# Patient Record
Sex: Female | Born: 1960
Health system: Southern US, Community
[De-identification: ages and names within clinical notes are randomized; demographics above are authoritative.]

## PROBLEM LIST (undated history)

## (undated) DIAGNOSIS — G629 Polyneuropathy, unspecified: Secondary | ICD-10-CM

## (undated) DIAGNOSIS — I639 Cerebral infarction, unspecified: Secondary | ICD-10-CM

## (undated) DIAGNOSIS — I351 Nonrheumatic aortic (valve) insufficiency: Secondary | ICD-10-CM

## (undated) DIAGNOSIS — F419 Anxiety disorder, unspecified: Secondary | ICD-10-CM

## (undated) DIAGNOSIS — M4722 Other spondylosis with radiculopathy, cervical region: Secondary | ICD-10-CM

## (undated) DIAGNOSIS — K5790 Diverticulosis of intestine, part unspecified, without perforation or abscess without bleeding: Secondary | ICD-10-CM

## (undated) DIAGNOSIS — I340 Nonrheumatic mitral (valve) insufficiency: Secondary | ICD-10-CM

## (undated) DIAGNOSIS — I10 Essential (primary) hypertension: Secondary | ICD-10-CM

## (undated) DIAGNOSIS — D649 Anemia, unspecified: Secondary | ICD-10-CM

## (undated) DIAGNOSIS — J45909 Unspecified asthma, uncomplicated: Secondary | ICD-10-CM

## (undated) DIAGNOSIS — I8290 Acute embolism and thrombosis of unspecified vein: Secondary | ICD-10-CM

## (undated) DIAGNOSIS — K5792 Diverticulitis of intestine, part unspecified, without perforation or abscess without bleeding: Secondary | ICD-10-CM

## (undated) DIAGNOSIS — E785 Hyperlipidemia, unspecified: Secondary | ICD-10-CM

## (undated) DIAGNOSIS — N289 Disorder of kidney and ureter, unspecified: Secondary | ICD-10-CM

## (undated) DIAGNOSIS — F1910 Other psychoactive substance abuse, uncomplicated: Secondary | ICD-10-CM

## (undated) DIAGNOSIS — F329 Major depressive disorder, single episode, unspecified: Secondary | ICD-10-CM

## (undated) DIAGNOSIS — IMO0002 Reserved for concepts with insufficient information to code with codable children: Secondary | ICD-10-CM

## (undated) HISTORY — DX: Acute embolism and thrombosis of unspecified vein: I82.90

## (undated) HISTORY — DX: Diverticulosis of intestine, part unspecified, without perforation or abscess without bleeding: K57.90

## (undated) HISTORY — DX: Unspecified asthma, uncomplicated: J45.909

## (undated) HISTORY — DX: Reserved for concepts with insufficient information to code with codable children: IMO0002

## (undated) HISTORY — DX: Diverticulitis of intestine, part unspecified, without perforation or abscess without bleeding: K57.92

## (undated) HISTORY — DX: Disorder of kidney and ureter, unspecified: N28.9

## (undated) HISTORY — DX: Nonrheumatic aortic (valve) insufficiency: I35.1

## (undated) HISTORY — DX: Anemia, unspecified: D64.9

## (undated) HISTORY — DX: Polyneuropathy, unspecified: G62.9

## (undated) HISTORY — PX: ORTHOPEDIC SURGERY: SHX850

## (undated) HISTORY — DX: Other psychoactive substance abuse, uncomplicated: F19.10

## (undated) HISTORY — DX: Hyperlipidemia, unspecified: E78.5

## (undated) HISTORY — DX: Nonrheumatic mitral (valve) insufficiency: I34.0

## (undated) HISTORY — DX: Major depressive disorder, single episode, unspecified: F32.9

## (undated) HISTORY — PX: CHOLECYSTECTOMY: SHX55

---

## 1990-01-16 HISTORY — PX: KNEE ARTHROSCOPY: SUR90

## 1998-06-19 ENCOUNTER — Encounter: Payer: Self-pay | Admitting: *Deleted

## 1998-06-19 ENCOUNTER — Inpatient Hospital Stay (HOSPITAL_COMMUNITY): Admission: AD | Admit: 1998-06-19 | Discharge: 1998-06-19 | Payer: Self-pay | Admitting: *Deleted

## 1998-06-29 ENCOUNTER — Encounter: Admission: RE | Admit: 1998-06-29 | Discharge: 1998-06-29 | Payer: Self-pay | Admitting: Obstetrics & Gynecology

## 1999-07-29 ENCOUNTER — Encounter (INDEPENDENT_AMBULATORY_CARE_PROVIDER_SITE_OTHER): Payer: Self-pay | Admitting: Specialist

## 1999-07-29 ENCOUNTER — Encounter (HOSPITAL_BASED_OUTPATIENT_CLINIC_OR_DEPARTMENT_OTHER): Payer: Self-pay | Admitting: General Surgery

## 1999-07-29 ENCOUNTER — Inpatient Hospital Stay (HOSPITAL_COMMUNITY): Admission: EM | Admit: 1999-07-29 | Discharge: 1999-07-31 | Payer: Self-pay | Admitting: Emergency Medicine

## 1999-07-30 ENCOUNTER — Encounter (HOSPITAL_BASED_OUTPATIENT_CLINIC_OR_DEPARTMENT_OTHER): Payer: Self-pay | Admitting: General Surgery

## 2001-01-16 HISTORY — PX: OTHER SURGICAL HISTORY: SHX169

## 2001-02-04 ENCOUNTER — Emergency Department (HOSPITAL_COMMUNITY): Admission: EM | Admit: 2001-02-04 | Discharge: 2001-02-04 | Payer: Self-pay | Admitting: Emergency Medicine

## 2001-03-27 ENCOUNTER — Other Ambulatory Visit: Admission: RE | Admit: 2001-03-27 | Discharge: 2001-03-27 | Payer: Self-pay | Admitting: Family Medicine

## 2001-05-15 ENCOUNTER — Ambulatory Visit (HOSPITAL_BASED_OUTPATIENT_CLINIC_OR_DEPARTMENT_OTHER): Admission: RE | Admit: 2001-05-15 | Discharge: 2001-05-15 | Payer: Self-pay | Admitting: Orthopedic Surgery

## 2002-04-16 ENCOUNTER — Emergency Department (HOSPITAL_COMMUNITY): Admission: EM | Admit: 2002-04-16 | Discharge: 2002-04-16 | Payer: Self-pay | Admitting: Emergency Medicine

## 2002-07-07 ENCOUNTER — Other Ambulatory Visit: Admission: RE | Admit: 2002-07-07 | Discharge: 2002-07-07 | Payer: Self-pay | Admitting: Family Medicine

## 2003-03-10 ENCOUNTER — Encounter: Admission: RE | Admit: 2003-03-10 | Discharge: 2003-03-10 | Payer: Self-pay | Admitting: Internal Medicine

## 2003-03-13 ENCOUNTER — Ambulatory Visit (HOSPITAL_COMMUNITY): Admission: RE | Admit: 2003-03-13 | Discharge: 2003-03-13 | Payer: Self-pay | Admitting: Internal Medicine

## 2003-04-20 ENCOUNTER — Encounter: Admission: RE | Admit: 2003-04-20 | Discharge: 2003-04-20 | Payer: Self-pay | Admitting: Internal Medicine

## 2003-05-20 ENCOUNTER — Encounter: Admission: RE | Admit: 2003-05-20 | Discharge: 2003-05-20 | Payer: Self-pay | Admitting: Internal Medicine

## 2003-08-18 ENCOUNTER — Emergency Department (HOSPITAL_COMMUNITY): Admission: EM | Admit: 2003-08-18 | Discharge: 2003-08-18 | Payer: Self-pay

## 2003-08-21 ENCOUNTER — Encounter: Admission: RE | Admit: 2003-08-21 | Discharge: 2003-08-21 | Payer: Self-pay | Admitting: Internal Medicine

## 2003-12-30 ENCOUNTER — Encounter
Admission: RE | Admit: 2003-12-30 | Discharge: 2004-02-08 | Payer: Self-pay | Admitting: Physical Medicine & Rehabilitation

## 2004-01-01 ENCOUNTER — Ambulatory Visit: Payer: Self-pay | Admitting: Physical Medicine & Rehabilitation

## 2004-01-03 ENCOUNTER — Encounter
Admission: RE | Admit: 2004-01-03 | Discharge: 2004-01-03 | Payer: Self-pay | Admitting: Physical Medicine & Rehabilitation

## 2004-02-02 ENCOUNTER — Ambulatory Visit: Payer: Self-pay | Admitting: Hematology & Oncology

## 2004-02-02 ENCOUNTER — Encounter: Admission: RE | Admit: 2004-02-02 | Discharge: 2004-05-02 | Payer: Self-pay | Admitting: Anesthesiology

## 2004-02-02 ENCOUNTER — Ambulatory Visit: Payer: Self-pay | Admitting: Anesthesiology

## 2004-02-05 ENCOUNTER — Ambulatory Visit (HOSPITAL_COMMUNITY): Admission: RE | Admit: 2004-02-05 | Discharge: 2004-02-05 | Payer: Self-pay | Admitting: Anesthesiology

## 2004-04-05 ENCOUNTER — Inpatient Hospital Stay (HOSPITAL_COMMUNITY): Admission: AD | Admit: 2004-04-05 | Discharge: 2004-04-05 | Payer: Self-pay | Admitting: *Deleted

## 2004-04-21 ENCOUNTER — Encounter: Admission: RE | Admit: 2004-04-21 | Discharge: 2004-04-21 | Payer: Self-pay | Admitting: Family Medicine

## 2004-10-02 ENCOUNTER — Emergency Department (HOSPITAL_COMMUNITY): Admission: EM | Admit: 2004-10-02 | Discharge: 2004-10-02 | Payer: Self-pay | Admitting: Emergency Medicine

## 2004-10-17 ENCOUNTER — Encounter: Admission: RE | Admit: 2004-10-17 | Discharge: 2005-01-15 | Payer: Self-pay | Admitting: Orthopedic Surgery

## 2005-07-21 ENCOUNTER — Other Ambulatory Visit: Admission: RE | Admit: 2005-07-21 | Discharge: 2005-07-21 | Payer: Self-pay | Admitting: Obstetrics and Gynecology

## 2005-07-26 ENCOUNTER — Ambulatory Visit (HOSPITAL_COMMUNITY): Admission: RE | Admit: 2005-07-26 | Discharge: 2005-07-26 | Payer: Self-pay | Admitting: Obstetrics and Gynecology

## 2005-08-24 ENCOUNTER — Other Ambulatory Visit: Admission: RE | Admit: 2005-08-24 | Discharge: 2005-08-24 | Payer: Self-pay | Admitting: Obstetrics and Gynecology

## 2005-10-14 ENCOUNTER — Emergency Department (HOSPITAL_COMMUNITY): Admission: EM | Admit: 2005-10-14 | Discharge: 2005-10-14 | Payer: Self-pay | Admitting: Emergency Medicine

## 2005-12-17 ENCOUNTER — Emergency Department (HOSPITAL_COMMUNITY): Admission: EM | Admit: 2005-12-17 | Discharge: 2005-12-17 | Payer: Self-pay | Admitting: Emergency Medicine

## 2006-07-08 ENCOUNTER — Emergency Department (HOSPITAL_COMMUNITY): Admission: EM | Admit: 2006-07-08 | Discharge: 2006-07-08 | Payer: Self-pay | Admitting: Emergency Medicine

## 2006-07-31 ENCOUNTER — Ambulatory Visit (HOSPITAL_COMMUNITY): Admission: RE | Admit: 2006-07-31 | Discharge: 2006-07-31 | Payer: Self-pay | Admitting: Obstetrics and Gynecology

## 2006-11-12 ENCOUNTER — Emergency Department (HOSPITAL_COMMUNITY): Admission: EM | Admit: 2006-11-12 | Discharge: 2006-11-12 | Payer: Self-pay | Admitting: Emergency Medicine

## 2007-08-01 ENCOUNTER — Ambulatory Visit (HOSPITAL_COMMUNITY): Admission: RE | Admit: 2007-08-01 | Discharge: 2007-08-01 | Payer: Self-pay | Admitting: Obstetrics and Gynecology

## 2008-08-07 ENCOUNTER — Emergency Department (HOSPITAL_COMMUNITY): Admission: EM | Admit: 2008-08-07 | Discharge: 2008-08-07 | Payer: Self-pay | Admitting: Emergency Medicine

## 2009-11-02 ENCOUNTER — Inpatient Hospital Stay (HOSPITAL_COMMUNITY)
Admission: AD | Admit: 2009-11-02 | Discharge: 2009-11-02 | Payer: Self-pay | Source: Home / Self Care | Admitting: Family Medicine

## 2009-12-09 ENCOUNTER — Emergency Department (HOSPITAL_COMMUNITY): Admission: EM | Admit: 2009-12-09 | Discharge: 2009-12-09 | Payer: Self-pay | Admitting: Family Medicine

## 2010-01-20 ENCOUNTER — Ambulatory Visit (HOSPITAL_COMMUNITY)
Admission: RE | Admit: 2010-01-20 | Discharge: 2010-01-20 | Payer: Self-pay | Source: Home / Self Care | Attending: Obstetrics and Gynecology | Admitting: Obstetrics and Gynecology

## 2010-01-20 LAB — CBC
HCT: 33.7 % — ABNORMAL LOW (ref 36.0–46.0)
Hemoglobin: 10.7 g/dL — ABNORMAL LOW (ref 12.0–15.0)
MCH: 25.1 pg — ABNORMAL LOW (ref 26.0–34.0)
MCHC: 31.8 g/dL (ref 30.0–36.0)
MCV: 79.1 fL (ref 78.0–100.0)
Platelets: 343 10*3/uL (ref 150–400)
RBC: 4.26 MIL/uL (ref 3.87–5.11)
RDW: 17.9 % — ABNORMAL HIGH (ref 11.5–15.5)
WBC: 8.3 10*3/uL (ref 4.0–10.5)

## 2010-01-20 LAB — PREGNANCY, URINE: Preg Test, Ur: NEGATIVE

## 2010-02-06 ENCOUNTER — Encounter: Payer: Self-pay | Admitting: Obstetrics and Gynecology

## 2010-03-30 LAB — CBC
HCT: 34.3 % — ABNORMAL LOW (ref 36.0–46.0)
Hemoglobin: 10.9 g/dL — ABNORMAL LOW (ref 12.0–15.0)
MCH: 26.2 pg (ref 26.0–34.0)
MCHC: 31.9 g/dL (ref 30.0–36.0)
MCV: 82.2 fL (ref 78.0–100.0)
Platelets: 358 10*3/uL (ref 150–400)
RBC: 4.17 MIL/uL (ref 3.87–5.11)
RDW: 20.8 % — ABNORMAL HIGH (ref 11.5–15.5)
WBC: 11.8 10*3/uL — ABNORMAL HIGH (ref 4.0–10.5)

## 2010-03-30 LAB — GC/CHLAMYDIA PROBE AMP, GENITAL
Chlamydia, DNA Probe: NEGATIVE
GC Probe Amp, Genital: NEGATIVE

## 2010-03-30 LAB — URINE MICROSCOPIC-ADD ON

## 2010-03-30 LAB — URINALYSIS, ROUTINE W REFLEX MICROSCOPIC
Bilirubin Urine: NEGATIVE
Glucose, UA: NEGATIVE mg/dL
Ketones, ur: NEGATIVE mg/dL
Leukocytes, UA: NEGATIVE
Nitrite: NEGATIVE
Protein, ur: NEGATIVE mg/dL
Specific Gravity, Urine: >1.03 — ABNORMAL HIGH (ref 1.005–1.030)
Urobilinogen, UA: 0.2 mg/dL (ref 0.0–1.0)
pH: 5.5 (ref 5.0–8.0)

## 2010-03-30 LAB — WET PREP, GENITAL
Trich, Wet Prep: NONE SEEN
Yeast Wet Prep HPF POC: NONE SEEN

## 2010-03-30 LAB — POCT PREGNANCY, URINE: Preg Test, Ur: NEGATIVE

## 2010-04-08 ENCOUNTER — Other Ambulatory Visit (HOSPITAL_COMMUNITY): Payer: Self-pay | Admitting: Family Medicine

## 2010-04-08 DIAGNOSIS — Z1231 Encounter for screening mammogram for malignant neoplasm of breast: Secondary | ICD-10-CM

## 2010-04-19 ENCOUNTER — Ambulatory Visit (HOSPITAL_COMMUNITY): Payer: Medicare Other

## 2010-04-26 ENCOUNTER — Ambulatory Visit (HOSPITAL_COMMUNITY)
Admission: RE | Admit: 2010-04-26 | Discharge: 2010-04-26 | Disposition: A | Discharge status: Home / Self Care | Payer: Medicare Other | Source: Ambulatory Visit | Attending: Family Medicine | Admitting: Family Medicine

## 2010-04-26 DIAGNOSIS — Z1231 Encounter for screening mammogram for malignant neoplasm of breast: Secondary | ICD-10-CM | POA: Insufficient documentation

## 2010-06-03 NOTE — Op Note (Signed)
[facility]  Patient:    Judith Mccullough, Judith Mccullough                    MRN: [id number] Proc. Date: 07/30/99 Adm. Date:  [id number] Disc. Date: [id number] Attending:  Fortino Sic CC:         Teena Irani. Arlyce Dice, M.D.                           Operative Report  PREOPERATIVE DIAGNOSIS:  Symptomatic cholelithiasis.  POSTOPERATIVE DIAGNOSIS: Symptomatic cholelithiasis.  OPERATION PERFORMED:  Laparoscopic cholecystectomy with intraoperative cholangiogram.  SURGEON:  Marnee Spring. Wiliam Ke, M.D.  ASSISTANT:  Mardene Celeste. Lurene Shadow, M.D.  ANESTHESIA:  Endotracheal by hospital.  DESCRIPTION OF PROCEDURE:  After good endotracheal anesthesia, the skin of the abdomen was prepped and draped in the usual manner.  The abdomen was entered through an incision just above the umbilicus. Pursestring suture was placed. The Hasson cannula was inserted and lower pneumoperitoneum was obtained.  The 10 mm and two 5 mm cannulas were placed in the usual fashion.  There were no other abnormalities seen with the laparoscope.  Dissecting the port of hepatis, the cystic duct and gallbladder junction was identified.  The cystic duct was cannulated, and a cholangiogram was perform using a fluoroscopic technique.  This was normal.  The cystic artery was triply clipped and cut leaving two clips proximally.  The cystic duct was triply clipped and cut leaving two clips proximally.  The gallbladder was removed from the liver bed with electrocautery current, and hemostasis was obtained with electrocautery current.  Hemostasis was thought to be good.  The right upper quadrant was copiously irrigated with saline and sucked dry.  The cannula was removed.  The pursestring suture was tied.  The wounds were closed with subcuticular 4-0 Dexon and Steri-Strips were applied.  Estimated blood loss 150 cc. The patient received no blood and left the operating room in satisfactory condition after sponge and needle counts  were verified. DD:  07/30/99 TD:  07/30/99 Job: [id number]

## 2010-06-03 NOTE — Op Note (Signed)
[facility]  Patient:    Judith Mccullough, Judith Mccullough Visit Number: [id number] MRN: [id number]          Service Type: DSU Location: [facility] Attending Physician:  Marlowe Shores Dictated by:   Artist Pais Mina Marble, M.D. Proc. Date: 05/15/01 Admit Date:  05/15/2001                             Operative Report  PREOPERATIVE DIAGNOSIS:  Chronic right thumb ulnar collateral ligament tear.  POSTOPERATIVE DIAGNOSIS:  Chronic right thumb ulnar collateral ligament tear.  PROCEDURES:  Reconstruction of chronic right thumb ulnar collateral ligament tear using extensor pollicis longus tendon transfer.  SURGEON:  Artist Pais. Mina Marble, M.D.  ASSISTANT:  Junius Roads. Ireton, P.A.C.  ANESTHESIA:  General.  TOURNIQUET TIME:  56 minutes.  COMPLICATIONS:  None.  DRAINS:  None.  DESCRIPTION OF PROCEDURE:  The patient was taken to the operating room.  After the induction of general anesthesia, the right upper extremity was prepped and draped in the usual sterile fashion.  An Esmarch was used to exsanguinate the limb.  The tourniquet was then inflated to 250 mmHg.  At this point in time, a C-shaped incision was made over the ulnar side of the metatarsophalangeal joint of the dominant right thumb and a large dorsal based flap was elevated. A large branch of the radial sensory nerve was identified and the volar aspect of the wound retracted appropriately.  At this point in time, the abductor aponeurosis was longitudinally splint 5 mm from its insertion of the EPL tendon and flaps were raised with the EPL tendon retracted dorsally and the remaining adductor aponeurosis volarly.  Next, the metatarsophalangeal joint capsule arthrotomy was performed longitudinally of the dorsal ulnar aspect. Dissection was carried down to the ulnar side of the metatarsophalangeal joint where a complete tear of the ulnar collateral ligament was seen coming off of the proximal phalanx, chronic  in nature.  A reconstruction was then undertaken using the EPL tendon as a static transfer.  A second incision was made on the dorsal radial aspect of the wrist and the APL musculotendinous junction was identified.  The EPL was also identified at the insertion of the thumb metacarpal base.  There were two separate and distinct slips.  The radial most slip was identified.  It was transected in the proximal wound at the musculotendinous junction, drawn underneath the skin, and then tunneled under the EPL and EPB.  It was then used to reconstruct the ulnar side of the collateral ligament area in the following manner.  The capsule overlying the ulnar side of the metacarpal ligament joint was pierced.  The EPL tendon transfer was passed through the capsule and then secured tightly into a transosseous canal that went from volar ulnar to dorsal radial on the base of the proximal phalanx, though recreating the ulnar collateral ligament.  This was tied over a well-padded button on the dorsal radial side of the thumb under appropriate tension.  Next, the capsule was repaired using 4-0 Mersilene to incorporate the tendon transfer as well followed by repair of the aponeurosis using the same 4-0 Mersilene stitch in a locked epitendonous-type suture.  The wound was then thoroughly irrigated.  The two incisions were closed with 3-0 Prolene in subcuticular stitches.  Steri-Strips, 4 x 4s, fluffs, and a radial gutter splint were applied.  The patient tolerated the procedure well and went to recovery in stable fashion.  Dictated by:   Artist Pais Mina Marble, M.D. Attending Physician:  Marlowe Shores DD:  05/15/01 TD:  05/15/01 Job: [id number]

## 2010-06-03 NOTE — Discharge Summary (Signed)
[facility]  Patient:    LAKYNN, HALVORSEN                    MRN: [id number] Adm. Date:  [id number] Disc. Date: [id number] Attending:  Fortino Sic CC:         Teena Irani. Arlyce Dice, M.D.                           Discharge Summary  ADMISSION DIAGNOSIS:  Symptomatic cholelithiasis.  DISCHARGE DIAGNOSIS:  Symptomatic cholelithiasis.  OPERATION PERFORMED:  July 30, 1999, laparoscopic cholecystectomy with intraoperative cholangiogram.  HISTORY OF PRESENT ILLNESS:  This is a 50 year old female with a three day history of abdominal pain and known gallstones.  Admission laboratory work revealed a slightly elevated SGPT, slightly elevated white count.  HOSPITAL COURSE:  The patient was admitted and prepared for surgery and surgery was performed.  Her postoperative course was benign.  At the time of discharge she is up and about tolerating a diet.  DISCHARGE CONDITION:  Stable.  DIET:  As tolerated.  AMBULATION:  No lifting or calisthenics.  DISCHARGE MEDICATION:  Dilaudid.  FOLLOW-UP: She is to see me in 6-7 days for wound care. DD:  07/31/99 TD:  07/31/99 Job: [id number]

## 2010-06-03 NOTE — Group Therapy Note (Signed)
REFERRING PHYSICIAN:  Ernestina Penna, M.D.   DATE OF BIRTH:  10-Mar-1960.   DATE OF SERVICE:  January 01, 2004.   MEDICAL RECORD NUMBER:  Is #[id number].   HISTORY:  The patient is a 50 year old female who injured her right thumb at  work with ulnar collateral ligament tear and underwent reconstructive  surgery on the ligament per Dr. Mina Marble in 2003.  She has had pain in her  right upper extremity pretty much since that time.  She indicates that it  goes up to her shoulder.  It runs along the lateral aspect of her arm down  to her thumb and first finger.  She rates her pain on average as a 6 out of  10, going from 5 to 8.  It improves with rest.  It is made worse with a lot  of use.  She has not worked since June 2004.  She has had additional workup  including bone scan, February 2005, showing no evidence of RSD.  She did  have some mild degenerative changes in the MCP and PIP of the right thumb.  She saw Dr. Metta Clines on one occasion and then also saw Dr. Yolanda Bonine here in  [place] who did a stellate ganglion block x 2 with some questionable  temporary relief.  She has tried high dose Neurontin, has tried a Media planner, has tried a TENS unit none of which were particularly helpful.  She  has tried Ultram with some equivocal results from this.   PAST MEDICAL HISTORY:  1.  Asthma.  2.  Hypertension.  3.  Depression/anxiety.  4.  Poor sleep.   DRUG ALLERGIES:  Are more of intolerance:  1.  CODEINE caused nausea.  2.  KLONOPIN caused chest pain.  3.  CYMBALTA caused a crazy feeling.   CURRENT MEDICATIONS:  1.  Toprol XL 100 mg p.o. every day.  2.  Advair inhaler p.r.n.  3.  Seroquel 25 p.o. every day.   SOCIAL HISTORY:  Smokes a pack a day for ten years, lives with two kids,  married but separated.   REVIEW OF SYSTEMS:  Positive for nausea, poor appetite, and also for  weakness and numbness right arm, some depression, poor sleep, and headaches.  Denies neck pain.  No  suicidal thoughts.   PHYSICAL EXAMINATION:  VITAL SIGNS:  Blood pressure 110/103.  She states she  took her blood pressure medicine this morning.  Pulse 90, respirations 16,  O2 sat 100% on room air.  GAIT:  Normal.  AFFECT:  Bright, alert.  APPEARANCE:  Normal.  NECK:  Full range of motion.  Spurling test is negative.  She has decreased  sensation, in fact hyperesthesia in C6 dermatome on the right side.  Equal  sensation in the 7th and 8th dermatomes.  MUSCULOSKELETAL:  Her strength is mildly reduced but with some pain  inhibition in the right deltoid biceps area, relatively intact in the  triceps, and reduction in grip secondary to pain in the thumb.  The left  upper extremity is 5/5 strength and lower extremities with normal strength  as well.  She has hyper-reflexic biceps, triceps reflexes bilaterally and  normal in the lower extremities at the patellar and Achilles.  She has good  range of motion at the shoulder.  Decreased range of motion right thumb and  she does guard it.  She has no skin dystrophic changes that I can note in  the upper extremity.  Her extremities are  warm.   IMPRESSION:  1.  Right upper extremity discomfort.  2.  History of ulnar collateral ligament tear, right thumb and has had a      more proximal spread to her pain symptoms following reconstructive      surgery.   1.  Typical treatment and workup for RSD have been unrevealing/unhelpful and      this is either a recalcitrant case or some other diagnosis may be      entertained.  To that effect, would check MRI of her cervical spine and      see whether she has a cervical radiculopathy C6 most likely involvement.  2.  Trial of Lyrica in case this is a mainly neuropathic pain syndrome and      would like to treat it as such.  3.  Referral to Dr. Celene Kras to see if there is anything for further      evaluation and evaluation from an invasive standpoint.  4.  I will see her back after Dr. Stevphen Rochester has had  a chance to evaluate her,      treat her.   Note, the patient states that she has no litigation pending in regards to  any of her complaints.  I do see a note, from December 30, 2002, noting that  she had an attorney however.  Mention this as a possible secondary gain  issue.   CURRENT FUNCTIONAL STATUS:  She is able to do all her self care and ADLs as  well as take care of her children; however, she does not feel like she does  as good of a job as she should be.     Andr   AEK/MedQ  D:  01/01/2004 13:34:17  T:  01/01/2004 16:50:42  Job #:  [id number]   cc:   Ernestina Penna, M.D.  [address] [place]  [place] [address]  Fax: [phone]   [facility]

## 2010-06-03 NOTE — Assessment & Plan Note (Signed)
HISTORY:  The [facility],  referred by Dr. Victorino Sparrow. Kirsteins.  She is a pleasant individual whom I  evaluated with the chart, available imaging, progress to date and overall  directed care approach.  She has not worked since 2003, stating that she  wants to get back to work, improve her function and quality of life indices.  She is trouble by right upper extremity pain.   I have reviewed the MRI.  It appears to be fairly normal, and she reveals no  external evidence of RSD or pseudo-motor changes, describing only modest  dysesthesias around the thumb.  She has had surgery here, but I see no  muscle wasting or decreased grip strength.  She appears to have adequate  functional capacity to the hand.  Pain is the persistent problem.  She also  has suprascapular and levator scapular pain, directed rightward, with the  patient's rotational capacity on flexion and extension.   I think she probably has a mixed component of pain in the hand and a  surgical procedure, peripheral neuropathy unspecified and cervicalgia,  probably secondary to cervical facet.   RECOMMENDATIONS:  1.  Cervical cessation for the best outcome.  2.  She has had stellate ganglion blocks, and other assessment by two other      pain doctors.  One is Dr. Jasmine Pang, stating that she has CRPS, but      failed stellate ganglion block.  Also Dr. Ewing Schlein, who is unsure.      I will try to obtain some records.  3.  As a diagnostic assessment, I am going to go ahead and review the bone      scan which was many months ago, and I think was equivocal.  I think it      needs a refreshment, as she is applying for disability, and I really do      not see any disabling features here.  By her own admission, she wants to      go back to work, which is promising.  I discussed modifiable features in      her health profile, such as cigarette cessation for the best outcome.  4.   Consider injecting the facet as a positive pain generator, with positive      provocative block, with consideration for radiofrequency neural ablation      which may reveal some of her pain presentation.  Certainly the      cigarettes and lifestyle will play into cervicalgia from a cervical      facet medial branch component.  5.  Will maintain her on her current medications.  I do not plan any changes      at this time.  She remains on Lyrica.  Consider adding Lidoderm.  6.  I am going to obtain a PNCV EMG of the upper extremity, to rule out      underlying pathology, which we might not be able to see here.  She does      not think she has had this done.  Will compare this to the three-phase      bone scan and determine further course of care.   OBJECTIVE:  Diffuse paracervical and suprascapular myofascial discomfort  with positive cervical facetal compression test, right greater than left.  Her hand has no impairment of grip strength.  Some distractibility.  Intact  neurological presentation.  Capillary filling is fine, and I do  not see any  pseudo-motor changes.  No new neurological findings in motor, sensory, or  reflexes.   IMPRESSION:  1.  Cervicalgia.  2.  Cervical facet syndrome.  3.  Peripheral neuropathy, unspecified.   PLAN:  As outlined above.   FOLLOWUP:  I will see her in followup for consideration of cervical facetal  injection.  Enhance our data base and functional assessment, as well as her  diagnostic parameters.  I have reviewed with the patient.  No barrier to  communication.       HH/MedQ  D:  02/02/2004 10:06:43  T:  02/02/2004 10:47:56  Job #:  [id number]

## 2010-08-07 ENCOUNTER — Inpatient Hospital Stay (HOSPITAL_COMMUNITY): Payer: Medicare Other

## 2010-08-07 ENCOUNTER — Emergency Department (HOSPITAL_COMMUNITY): Payer: Medicare Other

## 2010-08-07 ENCOUNTER — Inpatient Hospital Stay (HOSPITAL_COMMUNITY)
Admission: EM | Admit: 2010-08-07 | Discharge: 2010-08-11 | DRG: 064 | Disposition: A | Discharge status: Home / Self Care | Payer: Medicare Other | Source: Ambulatory Visit | Attending: Family Medicine | Admitting: Family Medicine

## 2010-08-07 ENCOUNTER — Emergency Department (HOSPITAL_COMMUNITY)
Admission: EM | Admit: 2010-08-07 | Discharge: 2010-08-07 | Disposition: A | Discharge status: Hospice | Payer: Medicare Other | Source: Home / Self Care | Attending: Emergency Medicine | Admitting: Emergency Medicine

## 2010-08-07 DIAGNOSIS — G90519 Complex regional pain syndrome I of unspecified upper limb: Secondary | ICD-10-CM | POA: Diagnosis present

## 2010-08-07 DIAGNOSIS — I6350 Cerebral infarction due to unspecified occlusion or stenosis of unspecified cerebral artery: Secondary | ICD-10-CM | POA: Insufficient documentation

## 2010-08-07 DIAGNOSIS — H6690 Otitis media, unspecified, unspecified ear: Secondary | ICD-10-CM | POA: Diagnosis present

## 2010-08-07 DIAGNOSIS — J189 Pneumonia, unspecified organism: Secondary | ICD-10-CM | POA: Diagnosis present

## 2010-08-07 DIAGNOSIS — R5383 Other fatigue: Secondary | ICD-10-CM | POA: Insufficient documentation

## 2010-08-07 DIAGNOSIS — N390 Urinary tract infection, site not specified: Secondary | ICD-10-CM | POA: Diagnosis present

## 2010-08-07 DIAGNOSIS — Z8249 Family history of ischemic heart disease and other diseases of the circulatory system: Secondary | ICD-10-CM

## 2010-08-07 DIAGNOSIS — H109 Unspecified conjunctivitis: Secondary | ICD-10-CM | POA: Diagnosis present

## 2010-08-07 DIAGNOSIS — F17200 Nicotine dependence, unspecified, uncomplicated: Secondary | ICD-10-CM | POA: Diagnosis present

## 2010-08-07 DIAGNOSIS — R5381 Other malaise: Secondary | ICD-10-CM | POA: Insufficient documentation

## 2010-08-07 DIAGNOSIS — R29810 Facial weakness: Secondary | ICD-10-CM | POA: Insufficient documentation

## 2010-08-07 DIAGNOSIS — E785 Hyperlipidemia, unspecified: Secondary | ICD-10-CM | POA: Diagnosis present

## 2010-08-07 DIAGNOSIS — Z79899 Other long term (current) drug therapy: Secondary | ICD-10-CM | POA: Insufficient documentation

## 2010-08-07 DIAGNOSIS — F1410 Cocaine abuse, uncomplicated: Secondary | ICD-10-CM | POA: Diagnosis present

## 2010-08-07 DIAGNOSIS — I10 Essential (primary) hypertension: Secondary | ICD-10-CM | POA: Insufficient documentation

## 2010-08-07 DIAGNOSIS — D649 Anemia, unspecified: Secondary | ICD-10-CM | POA: Diagnosis present

## 2010-08-07 DIAGNOSIS — F1210 Cannabis abuse, uncomplicated: Secondary | ICD-10-CM | POA: Diagnosis present

## 2010-08-07 DIAGNOSIS — I6340 Cerebral infarction due to embolism of unspecified cerebral artery: Principal | ICD-10-CM | POA: Diagnosis present

## 2010-08-07 DIAGNOSIS — Z7982 Long term (current) use of aspirin: Secondary | ICD-10-CM

## 2010-08-07 DIAGNOSIS — F341 Dysthymic disorder: Secondary | ICD-10-CM | POA: Diagnosis present

## 2010-08-07 DIAGNOSIS — R4789 Other speech disturbances: Secondary | ICD-10-CM | POA: Diagnosis present

## 2010-08-07 DIAGNOSIS — N289 Disorder of kidney and ureter, unspecified: Secondary | ICD-10-CM | POA: Diagnosis present

## 2010-08-07 DIAGNOSIS — G8190 Hemiplegia, unspecified affecting unspecified side: Secondary | ICD-10-CM | POA: Diagnosis present

## 2010-08-07 DIAGNOSIS — Z888 Allergy status to other drugs, medicaments and biological substances status: Secondary | ICD-10-CM

## 2010-08-07 DIAGNOSIS — D6859 Other primary thrombophilia: Secondary | ICD-10-CM | POA: Diagnosis present

## 2010-08-07 LAB — URINALYSIS, ROUTINE W REFLEX MICROSCOPIC
Bilirubin Urine: NEGATIVE
Bilirubin Urine: NEGATIVE
Glucose, UA: NEGATIVE mg/dL
Glucose, UA: NEGATIVE mg/dL
Hgb urine dipstick: NEGATIVE
Hgb urine dipstick: NEGATIVE
Ketones, ur: NEGATIVE mg/dL
Ketones, ur: NEGATIVE mg/dL
Leukocytes, UA: NEGATIVE
Nitrite: NEGATIVE
Nitrite: NEGATIVE
Protein, ur: NEGATIVE mg/dL
Protein, ur: NEGATIVE mg/dL
Specific Gravity, Urine: 1.023 (ref 1.005–1.030)
Specific Gravity, Urine: 1.013 (ref 1.005–1.030)
Urobilinogen, UA: 1 mg/dL (ref 0.0–1.0)
Urobilinogen, UA: 0.2 mg/dL (ref 0.0–1.0)
pH: 6 (ref 5.0–8.0)
pH: 6 (ref 5.0–8.0)

## 2010-08-07 LAB — COMPREHENSIVE METABOLIC PANEL
ALT: 10 U/L (ref 0–35)
ALT: 8 U/L (ref 0–35)
AST: 14 U/L (ref 0–37)
AST: 13 U/L (ref 0–37)
Albumin: 3.7 g/dL (ref 3.5–5.2)
Albumin: 3 g/dL — ABNORMAL LOW (ref 3.5–5.2)
Alkaline Phosphatase: 86 U/L (ref 39–117)
Alkaline Phosphatase: 76 U/L (ref 39–117)
BUN: 15 mg/dL (ref 6–23)
BUN: 12 mg/dL (ref 6–23)
CO2: 23 mEq/L (ref 19–32)
CO2: 23 mEq/L (ref 19–32)
Calcium: 9.8 mg/dL (ref 8.4–10.5)
Calcium: 9.1 mg/dL (ref 8.4–10.5)
Chloride: 102 mEq/L (ref 96–112)
Chloride: 102 mEq/L (ref 96–112)
Creatinine, Ser: 1.65 mg/dL — ABNORMAL HIGH (ref 0.50–1.10)
Creatinine, Ser: 1.46 mg/dL — ABNORMAL HIGH (ref 0.50–1.10)
GFR calc Af Amer: 40 mL/min — ABNORMAL LOW (ref >60)
GFR calc Af Amer: 46 mL/min — ABNORMAL LOW (ref >60)
GFR calc non Af Amer: 33 mL/min — ABNORMAL LOW (ref >60)
GFR calc non Af Amer: 38 mL/min — ABNORMAL LOW (ref >60)
Glucose, Bld: 124 mg/dL — ABNORMAL HIGH (ref 70–99)
Glucose, Bld: 109 mg/dL — ABNORMAL HIGH (ref 70–99)
Potassium: 3.9 mEq/L (ref 3.5–5.1)
Potassium: 3.8 mEq/L (ref 3.5–5.1)
Sodium: 135 mEq/L (ref 135–145)
Sodium: 136 mEq/L (ref 135–145)
Total Bilirubin: 0.2 mg/dL — ABNORMAL LOW (ref 0.3–1.2)
Total Bilirubin: 0.3 mg/dL (ref 0.3–1.2)
Total Protein: 7.7 g/dL (ref 6.0–8.3)
Total Protein: 6.8 g/dL (ref 6.0–8.3)

## 2010-08-07 LAB — DIFFERENTIAL
Basophils Absolute: 0.1 10*3/uL (ref 0.0–0.1)
Basophils Relative: 0 % (ref 0–1)
Eosinophils Absolute: 0.1 10*3/uL (ref 0.0–0.7)
Eosinophils Relative: 0 % (ref 0–5)
Lymphocytes Relative: 23 % (ref 12–46)
Lymphs Abs: 3.1 10*3/uL (ref 0.7–4.0)
Monocytes Absolute: 0.6 10*3/uL (ref 0.1–1.0)
Monocytes Relative: 4 % (ref 3–12)
Neutro Abs: 9.8 10*3/uL — ABNORMAL HIGH (ref 1.7–7.7)
Neutrophils Relative %: 72 % (ref 43–77)

## 2010-08-07 LAB — CK TOTAL AND CKMB (NOT AT ARMC)
CK, MB: 1.8 ng/mL (ref 0.3–4.0)
Relative Index: 1 (ref 0.0–2.5)
Total CK: 188 U/L — ABNORMAL HIGH (ref 7–177)

## 2010-08-07 LAB — TROPONIN I: Troponin I: <0.3 ng/mL (ref <0.30)

## 2010-08-07 LAB — POCT I-STAT, CHEM 8
BUN: 16 mg/dL (ref 6–23)
Calcium, Ion: 1.19 mmol/L (ref 1.12–1.32)
Chloride: 105 mEq/L (ref 96–112)
Creatinine, Ser: 1.6 mg/dL — ABNORMAL HIGH (ref 0.50–1.10)
Glucose, Bld: 136 mg/dL — ABNORMAL HIGH (ref 70–99)
HCT: 38 % (ref 36.0–46.0)
Hemoglobin: 12.9 g/dL (ref 12.0–15.0)
Potassium: 4.1 mEq/L (ref 3.5–5.1)
Sodium: 138 mEq/L (ref 135–145)
TCO2: 23 mmol/L (ref 0–100)

## 2010-08-07 LAB — CARDIAC PANEL(CRET KIN+CKTOT+MB+TROPI)
CK, MB: 1.9 ng/mL (ref 0.3–4.0)
Relative Index: 1 (ref 0.0–2.5)
Total CK: 188 U/L — ABNORMAL HIGH (ref 7–177)
Troponin I: <0.3 ng/mL (ref <0.30)

## 2010-08-07 LAB — CBC
HCT: 36 % (ref 36.0–46.0)
HCT: 33.7 % — ABNORMAL LOW (ref 36.0–46.0)
Hemoglobin: 12 g/dL (ref 12.0–15.0)
Hemoglobin: 11.2 g/dL — ABNORMAL LOW (ref 12.0–15.0)
MCH: 27 pg (ref 26.0–34.0)
MCH: 27 pg (ref 26.0–34.0)
MCHC: 33.3 g/dL (ref 30.0–36.0)
MCHC: 33.2 g/dL (ref 30.0–36.0)
MCV: 80.9 fL (ref 78.0–100.0)
MCV: 81.2 fL (ref 78.0–100.0)
Platelets: 379 10*3/uL (ref 150–400)
Platelets: 361 10*3/uL (ref 150–400)
RBC: 4.45 MIL/uL (ref 3.87–5.11)
RBC: 4.15 MIL/uL (ref 3.87–5.11)
RDW: 16.9 % — ABNORMAL HIGH (ref 11.5–15.5)
RDW: 17.2 % — ABNORMAL HIGH (ref 11.5–15.5)
WBC: 13.5 10*3/uL — ABNORMAL HIGH (ref 4.0–10.5)
WBC: 15.2 10*3/uL — ABNORMAL HIGH (ref 4.0–10.5)

## 2010-08-07 LAB — URINE MICROSCOPIC-ADD ON

## 2010-08-07 LAB — APTT
aPTT: 29 seconds (ref 24–37)
aPTT: 28 seconds (ref 24–37)

## 2010-08-07 LAB — GLUCOSE, CAPILLARY
Glucose-Capillary: 136 mg/dL — ABNORMAL HIGH (ref 70–99)
Glucose-Capillary: 97 mg/dL (ref 70–99)

## 2010-08-07 LAB — PROTIME-INR
INR: 1.01 (ref 0.00–1.49)
INR: 0.94 (ref 0.00–1.49)
Prothrombin Time: 13.5 seconds (ref 11.6–15.2)
Prothrombin Time: 12.8 seconds (ref 11.6–15.2)

## 2010-08-07 LAB — RAPID URINE DRUG SCREEN, HOSP PERFORMED
Amphetamines: NOT DETECTED
Barbiturates: NOT DETECTED
Benzodiazepines: NOT DETECTED
Cocaine: POSITIVE — AB
Opiates: NOT DETECTED
Tetrahydrocannabinol: POSITIVE — AB

## 2010-08-07 LAB — LIPASE, BLOOD: Lipase: 20 U/L (ref 11–59)

## 2010-08-07 LAB — HEMOGLOBIN A1C
Hgb A1c MFr Bld: 6.2 % — ABNORMAL HIGH (ref <5.7)
Mean Plasma Glucose: 131 mg/dL — ABNORMAL HIGH (ref <117)

## 2010-08-07 LAB — MRSA PCR SCREENING: MRSA by PCR: NEGATIVE

## 2010-08-08 ENCOUNTER — Inpatient Hospital Stay (HOSPITAL_COMMUNITY): Payer: Medicare Other

## 2010-08-08 LAB — CBC
HCT: 34.3 % — ABNORMAL LOW (ref 36.0–46.0)
Hemoglobin: 11.1 g/dL — ABNORMAL LOW (ref 12.0–15.0)
MCH: 26.6 pg (ref 26.0–34.0)
MCHC: 32.4 g/dL (ref 30.0–36.0)
MCV: 82.3 fL (ref 78.0–100.0)
Platelets: 363 10*3/uL (ref 150–400)
RBC: 4.17 MIL/uL (ref 3.87–5.11)
RDW: 17.4 % — ABNORMAL HIGH (ref 11.5–15.5)
WBC: 14.2 10*3/uL — ABNORMAL HIGH (ref 4.0–10.5)

## 2010-08-08 LAB — LIPID PANEL
Cholesterol: 218 mg/dL — ABNORMAL HIGH (ref 0–200)
HDL: 33 mg/dL — ABNORMAL LOW (ref >39)
LDL Cholesterol: 130 mg/dL — ABNORMAL HIGH (ref 0–99)
Total CHOL/HDL Ratio: 6.6 RATIO
Triglycerides: 277 mg/dL — ABNORMAL HIGH (ref <150)
VLDL: 55 mg/dL — ABNORMAL HIGH (ref 0–40)

## 2010-08-08 LAB — COMPREHENSIVE METABOLIC PANEL
ALT: 8 U/L (ref 0–35)
AST: 14 U/L (ref 0–37)
Albumin: 2.9 g/dL — ABNORMAL LOW (ref 3.5–5.2)
Alkaline Phosphatase: 75 U/L (ref 39–117)
BUN: 10 mg/dL (ref 6–23)
CO2: 24 mEq/L (ref 19–32)
Calcium: 9 mg/dL (ref 8.4–10.5)
Chloride: 106 mEq/L (ref 96–112)
Creatinine, Ser: 1.51 mg/dL — ABNORMAL HIGH (ref 0.50–1.10)
GFR calc Af Amer: 45 mL/min — ABNORMAL LOW (ref >60)
GFR calc non Af Amer: 37 mL/min — ABNORMAL LOW (ref >60)
Glucose, Bld: 97 mg/dL (ref 70–99)
Potassium: 4 mEq/L (ref 3.5–5.1)
Sodium: 139 mEq/L (ref 135–145)
Total Bilirubin: 0.4 mg/dL (ref 0.3–1.2)
Total Protein: 6.6 g/dL (ref 6.0–8.3)

## 2010-08-08 LAB — PROTIME-INR
INR: 1.04 (ref 0.00–1.49)
Prothrombin Time: 13.8 seconds (ref 11.6–15.2)

## 2010-08-08 LAB — TSH: TSH: 1.82 u[IU]/mL (ref 0.350–4.500)

## 2010-08-08 MED ORDER — GADOBENATE DIMEGLUMINE 529 MG/ML IV SOLN: 20.0000 mL | Freq: Once | INTRAVENOUS | Status: AC | PRN

## 2010-08-08 MED ORDER — GADOBENATE DIMEGLUMINE 529 MG/ML IV SOLN
20.0000 mL | Freq: Once | INTRAVENOUS | Status: AC | PRN
  Administered 2010-08-08: 20 mL via INTRAVENOUS

## 2010-08-08 NOTE — H&P (Signed)
Judith Mccullough, Judith Mccullough          ACCOUNT NO.:  [id number]  MEDICAL RECORD NO.:  [id number]  LOCATION:                                 FACILITY:  PHYSICIAN:  Conley Canal, MD      DATE OF BIRTH:  Feb 05, 1960  DATE OF ADMISSION: DATE OF DISCHARGE:                             HISTORY & PHYSICAL   PRIMARY CARE PHYSICIAN:  Tracey Harries, MD  CHIEF COMPLAINT:  Slurred speech, left-sided weakness.  HISTORY OF PRESENT ILLNESS:  [name] is a 50 year old female with history of hypertension, apparently polysubstance abuse, tobacco habituation who came in as a transfer from [facility] where she had presented with complaints of slurred speech and left-sided weakness.  The patient states that she noticed something was wrong last night, she could not specify the time when she felt that something was wrong, but she did not come to the emergency room immediately as she was worried.  She noted that her speech was slurred and she has had right-sided headache since last night and she went to sleep and when she wake up this morning at around 4:00 a.m. with the same symptoms, she decided to come to the emergency room.  She denies any other complaints.  No fever.  No shortness of breath.  No cough.  No diarrhea, vomiting.  No genitourinary symptoms but when she presented at first she was found to have an acute right insular ribbon sign consistent with acute ischemia and was hence referred to the hospitalist service for further management.  She was not given t-PA apparently because she was outside the stroke window as the time of beginning of symptoms was not clear.  A urine toxicology screen was positive for cocaine, marijuana.  PAST MEDICAL HISTORY: 1. Hypertension. 2. Tobacco habituation.  ALLERGIES:  CODEINE.  SOCIAL HISTORY:  The patient lives with her son.  Smokes cigars occasionally.  Drinks alcohol.  She is on disability.  FAMILY HISTORY:  She mentions  history of acute myocardial infarction in her father.  HOME MEDICATIONS:  Diovan.  REVIEW OF SYSTEMS:  Unremarkable except as highlighted in the history of present illness.  PHYSICAL EXAMINATION:  GENERAL:  This is a middle-aged lady, not in acute distress.  She has loss of left nasolabial fold. HEENT:  Pupils equal, reacting to light. NECK:  No jugular venous distention.  No carotid bruits. RESPIRATORY:  She has a weak cough, coarse rhonchi at left base.  No wheezing. CARDIOVASCULAR:  First and second heart sounds heard.  No murmurs. Pulse regular. ABDOMEN:  Soft, nontender.  No palpable organomegaly.  Bowel sounds normal. CENTRAL NERVOUS SYSTEM:  The patient has slurred speech, loss of left nasolabial fold.  Power of 4/5 in the left upper extremity, 5/5 in all other extremities.  Some hyperreflexia, left knee.  Babinski upgoing, left foot.  IMAGING STUDIES:  A CT brain without contrast showed right insular ribbon sign which is most in keeping with acute ischemia.  I do not see an EKG.  IMPRESSION:  This is a 50 year old female with history of tobacco habituation, polysubstance abuse, hypertension who came in with acute infarct involving the right insular region.  She also has possibility of left lower  lobe pneumonia.  Her labs also include BUN of 16, creatinine 1.6, WBC is 8.5.  She likely aspirated and possibly has a urinary tract infection.  PLAN: 1. Acute nonhemorrhagic infarct in the right insular region with     slurred speech and left hemiparesis.  Cause could be the cocaine,     hypertension, and other possible etiologies.  The patient will be     admitted to the neuro ICU.  Neurology input is appreciated.     Meanwhile, we will pursue stroke workup including MRI of brain, 2-D     echocardiogram, carotid duplex, lipid panel.  We will also ask     physical therapy and speech therapy to work with her. 2. Hypertension.  The patient is on Diovan at home.  We will  monitor     BP.  Hydralazine p.r.n. 3. Possible left lower lobe pneumonia, community acquired versus     aspiration and also possibility of UTI.  We will cover the patient     with Zosyn and azithromycin. 4. Renal insufficiency could probably be related to dehydration and     Diovan.  We will hold Diovan, gently rehydrate the patient, avoid     nephrotoxic drugs. 5. DVT prophylaxis.     Conley Canal, MD     SR/MEDQ  D:  08/07/2010  T:  08/07/2010  Job:  [id number]  cc:   Tracey Harries, M.D.  Electronically Signed by Conley Canal  on 08/08/2010 02:52:04 PM

## 2010-08-09 DIAGNOSIS — I6330 Cerebral infarction due to thrombosis of unspecified cerebral artery: Secondary | ICD-10-CM

## 2010-08-09 LAB — BASIC METABOLIC PANEL
BUN: 10 mg/dL (ref 6–23)
CO2: 25 mEq/L (ref 19–32)
Calcium: 8.9 mg/dL (ref 8.4–10.5)
Chloride: 105 mEq/L (ref 96–112)
Creatinine, Ser: 1.55 mg/dL — ABNORMAL HIGH (ref 0.50–1.10)
GFR calc Af Amer: 43 mL/min — ABNORMAL LOW (ref >60)
GFR calc non Af Amer: 36 mL/min — ABNORMAL LOW (ref >60)
Glucose, Bld: 91 mg/dL (ref 70–99)
Potassium: 3.9 mEq/L (ref 3.5–5.1)
Sodium: 137 mEq/L (ref 135–145)

## 2010-08-09 LAB — CBC
HCT: 33.2 % — ABNORMAL LOW (ref 36.0–46.0)
Hemoglobin: 10.6 g/dL — ABNORMAL LOW (ref 12.0–15.0)
MCH: 26.3 pg (ref 26.0–34.0)
MCHC: 31.9 g/dL (ref 30.0–36.0)
MCV: 82.4 fL (ref 78.0–100.0)
Platelets: 324 10*3/uL (ref 150–400)
RBC: 4.03 MIL/uL (ref 3.87–5.11)
RDW: 17 % — ABNORMAL HIGH (ref 11.5–15.5)
WBC: 11.8 10*3/uL — ABNORMAL HIGH (ref 4.0–10.5)

## 2010-08-09 LAB — SEDIMENTATION RATE: Sed Rate: 51 mm/hr — ABNORMAL HIGH (ref 0–22)

## 2010-08-09 LAB — URINE CULTURE
Colony Count: NO GROWTH
Culture  Setup Time: 201207230121
Culture: NO GROWTH

## 2010-08-09 LAB — RPR: RPR Ser Ql: NONREACTIVE

## 2010-08-09 LAB — HIV ANTIBODY (ROUTINE TESTING W REFLEX): HIV: NONREACTIVE

## 2010-08-09 LAB — HOMOCYSTEINE: Homocysteine: 9.9 umol/L (ref 4.0–15.4)

## 2010-08-10 ENCOUNTER — Inpatient Hospital Stay (HOSPITAL_COMMUNITY): Payer: Medicare Other

## 2010-08-10 DIAGNOSIS — I6789 Other cerebrovascular disease: Secondary | ICD-10-CM

## 2010-08-10 LAB — CBC
HCT: 33.9 % — ABNORMAL LOW (ref 36.0–46.0)
Hemoglobin: 11 g/dL — ABNORMAL LOW (ref 12.0–15.0)
MCH: 26.4 pg (ref 26.0–34.0)
MCHC: 32.4 g/dL (ref 30.0–36.0)
MCV: 81.3 fL (ref 78.0–100.0)
Platelets: 336 10*3/uL (ref 150–400)
RBC: 4.17 MIL/uL (ref 3.87–5.11)
RDW: 16.6 % — ABNORMAL HIGH (ref 11.5–15.5)
WBC: 10.2 10*3/uL (ref 4.0–10.5)

## 2010-08-10 LAB — FOLATE: Folate: 9.8 ng/mL

## 2010-08-10 LAB — BASIC METABOLIC PANEL
BUN: 8 mg/dL (ref 6–23)
CO2: 24 mEq/L (ref 19–32)
Calcium: 9.3 mg/dL (ref 8.4–10.5)
Chloride: 102 mEq/L (ref 96–112)
Creatinine, Ser: 1.54 mg/dL — ABNORMAL HIGH (ref 0.50–1.10)
GFR calc Af Amer: 44 mL/min — ABNORMAL LOW (ref >60)
GFR calc non Af Amer: 36 mL/min — ABNORMAL LOW (ref >60)
Glucose, Bld: 88 mg/dL (ref 70–99)
Potassium: 3.7 mEq/L (ref 3.5–5.1)
Sodium: 136 mEq/L (ref 135–145)

## 2010-08-10 LAB — LUPUS ANTICOAGULANT PANEL
DRVVT: 44.2 secs (ref 36.2–44.3)
Lupus Anticoagulant: NOT DETECTED
PTT Lupus Anticoagulant: 41.3 secs (ref 30.0–45.6)

## 2010-08-10 LAB — ANA: Anti Nuclear Antibody(ANA): NEGATIVE

## 2010-08-10 LAB — PROTEIN C ACTIVITY: Protein C Activity: 171 % — ABNORMAL HIGH (ref 75–133)

## 2010-08-10 LAB — C3 COMPLEMENT: C3 Complement: 167 mg/dL (ref 90–180)

## 2010-08-10 LAB — IRON AND TIBC
Iron: 27 ug/dL — ABNORMAL LOW (ref 42–135)
Saturation Ratios: 8 % — ABNORMAL LOW (ref 20–55)
TIBC: 327 ug/dL (ref 250–470)
UIBC: 300 ug/dL

## 2010-08-10 LAB — ANTITHROMBIN III: AntiThromb III Func: 130 % — ABNORMAL HIGH (ref 76–126)

## 2010-08-10 LAB — PROTEIN C, TOTAL: Protein C, Total: 175 % — ABNORMAL HIGH (ref 72–160)

## 2010-08-10 LAB — PROTEIN S, TOTAL: Protein S Ag, Total: 180 % — ABNORMAL HIGH (ref 60–150)

## 2010-08-10 LAB — PROTEIN S ACTIVITY: Protein S Activity: 111 % (ref 69–129)

## 2010-08-10 LAB — FERRITIN: Ferritin: 58 ng/mL (ref 10–291)

## 2010-08-10 LAB — C4 COMPLEMENT: Complement C4, Body Fluid: 47 mg/dL — ABNORMAL HIGH (ref 10–40)

## 2010-08-10 LAB — FACTOR 5 LEIDEN

## 2010-08-10 LAB — VITAMIN B12: Vitamin B-12: 657 pg/mL (ref 211–911)

## 2010-08-11 ENCOUNTER — Inpatient Hospital Stay (HOSPITAL_COMMUNITY): Payer: Medicare Other

## 2010-08-11 LAB — PROTHROMBIN GENE MUTATION

## 2010-08-11 LAB — COMPLEMENT, TOTAL: Compl, Total (CH50): >60 U/mL — ABNORMAL HIGH (ref 31–60)

## 2010-08-12 LAB — BETA-2-GLYCOPROTEIN I ABS, IGG/M/A
Beta-2 Glyco I IgG: 0 G Units (ref <20)
Beta-2-Glycoprotein I IgA: 3 A Units (ref <20)
Beta-2-Glycoprotein I IgM: 6 M Units (ref <20)

## 2010-08-12 LAB — CARDIOLIPIN ANTIBODIES, IGG, IGM, IGA
Anticardiolipin IgA: 3 APL U/mL — ABNORMAL LOW (ref <22)
Anticardiolipin IgG: 3 GPL U/mL — ABNORMAL LOW (ref <23)
Anticardiolipin IgM: 5 MPL U/mL — ABNORMAL LOW (ref <11)

## 2010-08-14 NOTE — Discharge Summary (Signed)
Judith Mccullough, EDDLEMAN NO.:  [id number]  MEDICAL RECORD NO.:  [id number]  LOCATION:  3010                         FACILITY:  [facility]  PHYSICIAN:  Pleas Koch, MD        DATE OF BIRTH:  12-Jul-1960  DATE OF ADMISSION:  08/07/2010 DATE OF DISCHARGE:  08/11/2010                              DISCHARGE SUMMARY   PRIMARY CARE PHYSICIAN:  Pramod P. Pearlean Brownie, MD  DISCHARGE DIAGNOSES: 1. Right insular ribbon sign suggestive of acute ischemia and exam     consisted with right hemispheric cerebrovascular accident with INA     score of 99 on presentation. 2. Urinary tract infection. 3. Probable pneumonia. 4. Polysubstance abuse. 5. Hyperlipidemia with LDL of 130, total cholesterol 218. 6. Questionable hypercoagulable state, antithrombin III at 130,     protein C functional 171, protein C total 175, TH 50 compliment     over 60. 7. Anemia. 8. Conjunctivitis.  DISCHARGE MEDICATIONS:  As follows; 1. Diovan HCT 320/25 one tablet daily. 2. Ciprofloxacin Otic drops 1 tablet t.i.d. 3. Amlodipine 10 mg 1 tablet daily. 4. Crestor 10 mg at bedtime. 5. Aspirin 325 mg p.o. daily.  PERTINENT IMAGING STUDIES: 1. CT head without contrast on August 07, 2010 showed right insular     ribbon sign, mostly acute ischemia. 2. Portable chest x-ray on August 07, 2010 showed no acute disease. 3. MRA head showed prominent hyperintense inferior division of the     right cerebral artery with distal flow signal to distal extent and     recanalizing thrombus.     a.     No occlusion, stenosis, dissections, or aneurysm pain. 4. MRA of the neck showed no occlusion, dissection, or pseudoaneurysm     noted.  There is just a possible moderate stenosis in the left     vertebral basilar junction. 5. MR brain with and without contrast on August 08, 2010, showed; 6. Large restriction deficit, right middle cerebral artery as     discussed with.  She may have subsequent nonspecific subcortical     white matter  changes supratentorially.  These most likely represent     areas of ischemia that is related to small vessel disease due to     hypertension, diabetes less likely possibility of demyelinating     process of vasculitis, subsequent moderate inflammatory mucosal     thickening, paranasal sinus thickening.  Chest x-ray report showed     slight haziness at both lung bases which could represent faint     infiltrates, minimal left base atelectasis.  Swallowing study done.     Barium swallow done shows that she should be on regular diet with     thin liquids; that was August 11, 2010.  TEE done August 10, 2010, showed EF of 60%-65%, triggered with aortic regurg and mild mitral regurg, that is thrombus and atrial cavity or appendage.  No foreign material  identified.  This is a pleasant 50 year old female with hypertension, polysubstance abuse, and tobacco habituation who presented to [facility] ED with paresthesias and left-sided weakness.  She felt something was wrong and when she noticed her speech was slurred and right-sided headache, she went and slept and  woke up this morning with same symptoms and came to the emergency room.  She was not given TPA because she already had stroke window at that time and beginning of symptoms is not clear.  The urine tox is positive for cocaine and marijuana.  On exam, she was not in acute discharge.  She had loss of left nasolabial fold.  She had a weak cough, coarse rhonchi to the left base. No wheezing.  Slurred speech.  Loss of left nasolabial fold.  Power 4/5 upper extremity, left, and 5/5 in all other extremities.  Some hyperreflexia in the left knee.  Babinski upgoing in the left foot.  HOSPITAL COURSE: 1. For inpatient, acute nonhemorrhagic infarct.  Causes are unclear.     Noted that her antithrombin panel had came back.  I discussed this     with the hospitalist on-call and he said this would be passed out     to Dr. Pearlean Brownie.  Neurology was consulted  initially and they     recommended that the patient be discharged home on aspirin only and     to follow up with Dr. Pearlean Brownie in an outpatient setting.  The patient     was cleared by physical therapy, but they recommended outpatient     physical therapy and occupational therapy. 2. The patient with possible pneumonia.  The patient is covered with     Zosyn and azithromycin initially and subsequently transitioned to     only azithromycin.  She still had coughing and this was thought     possibly secondary to aspiration. 3. Modified barium swallow was done which revealed this is not the     case.  The patient was discharged home without antibiotics.  She     was afebrile and did not have any white count and had no fever. 4. Hypertension.  Her hypertension is slightly elevated in hospital.     Her thiazide was held initially.  We have added back various     medications and added Norvasc to them and blood pressures fairly     well controlled in the hospital. 5. Possible otitis.  She was given Ciprodex eardrops for her ears. 6. Conjunctivitis.  I gave her some saline eyedrops for her eyes and     this helped. 7. Hyperlipidemia. 8. Her LDL was 130 and she has been placed on Crestor.  The patient     was seen on day of discharge, was cleared by Neurology.  DISCHARGE VITAL SIGNS:  Temperature is 98.1, pulse 90, respirations 16, blood pressure 126-135/80-84, O2 sats are 97% on room air.  She still had a moderate left-sided droop and mild hemineglect on the left side and some mild weakness; however, was doing well.  She had no chest pain, no shortness of breath, no fever and will be discharged home.  The patient instructed not to forget to follow up with primary care physician as well as Dr. Pearlean Brownie in 2 to 3 months.          ______________________________ Pleas Koch, MD    JS/MEDQ  D:  08/11/2010  T:  08/11/2010  Job:  [id number]  cc:   Tracey Harries, M.D.  Electronically Signed by Pleas Koch MD on 08/14/2010 12:29:26 PM

## 2010-10-15 ENCOUNTER — Inpatient Hospital Stay (INDEPENDENT_AMBULATORY_CARE_PROVIDER_SITE_OTHER)
Admission: RE | Admit: 2010-10-15 | Discharge: 2010-10-15 | Disposition: A | Discharge status: Home / Self Care | Payer: Medicare Other | Source: Ambulatory Visit | Attending: Family Medicine | Admitting: Family Medicine

## 2010-10-15 DIAGNOSIS — J309 Allergic rhinitis, unspecified: Secondary | ICD-10-CM

## 2010-11-13 ENCOUNTER — Inpatient Hospital Stay (INDEPENDENT_AMBULATORY_CARE_PROVIDER_SITE_OTHER)
Admission: RE | Admit: 2010-11-13 | Discharge: 2010-11-13 | Disposition: A | Discharge status: Home / Self Care | Payer: PRIVATE HEALTH INSURANCE | Source: Ambulatory Visit | Attending: Family Medicine | Admitting: Family Medicine

## 2010-11-13 DIAGNOSIS — J069 Acute upper respiratory infection, unspecified: Secondary | ICD-10-CM

## 2011-01-04 ENCOUNTER — Emergency Department (INDEPENDENT_AMBULATORY_CARE_PROVIDER_SITE_OTHER)
Admission: EM | Admit: 2011-01-04 | Discharge: 2011-01-04 | Disposition: A | Discharge status: Home / Self Care | Payer: Medicare Other | Source: Home / Self Care | Attending: Emergency Medicine | Admitting: Emergency Medicine

## 2011-01-04 ENCOUNTER — Encounter: Payer: Self-pay | Admitting: *Deleted

## 2011-01-04 DIAGNOSIS — S39012A Strain of muscle, fascia and tendon of lower back, initial encounter: Secondary | ICD-10-CM

## 2011-01-04 DIAGNOSIS — S335XXA Sprain of ligaments of lumbar spine, initial encounter: Secondary | ICD-10-CM

## 2011-01-04 DIAGNOSIS — J069 Acute upper respiratory infection, unspecified: Secondary | ICD-10-CM

## 2011-01-04 HISTORY — DX: Anxiety disorder, unspecified: F41.9

## 2011-01-04 HISTORY — DX: Essential (primary) hypertension: I10

## 2011-01-04 HISTORY — DX: Cerebral infarction, unspecified: I63.9

## 2011-01-04 LAB — POCT URINALYSIS DIP (DEVICE)
Bilirubin Urine: NEGATIVE
Glucose, UA: NEGATIVE mg/dL
Hgb urine dipstick: NEGATIVE
Ketones, ur: NEGATIVE mg/dL
Leukocytes, UA: NEGATIVE
Nitrite: NEGATIVE
Protein, ur: NEGATIVE mg/dL
Specific Gravity, Urine: 1.025 (ref 1.005–1.030)
Urobilinogen, UA: 0.2 mg/dL (ref 0.0–1.0)
pH: 5 (ref 5.0–8.0)

## 2011-01-04 LAB — POCT PREGNANCY, URINE: Preg Test, Ur: NEGATIVE

## 2011-01-04 MED ORDER — HYDROCODONE-ACETAMINOPHEN 5-325 MG PO TABS: 2.0000 | ORAL_TABLET | ORAL | Status: AC | PRN

## 2011-01-04 MED ORDER — METHOCARBAMOL 500 MG PO TABS: 500.0000 mg | ORAL_TABLET | Freq: Four times a day (QID) | ORAL | Status: AC

## 2011-01-04 NOTE — ED Provider Notes (Signed)
History     CSN: [id number] Arrival date & time: 01/04/2011  8:38 AM   First MD Initiated Contact with Patient 01/04/11 [id number]      Chief Complaint  Patient presents with  . Back Pain  . URI    HPI Comments: Pt with constant achy nonradiating right lower thoracic/paralumbar pain that becomes sharp with bending over, torso rotation x 3 days. Has nausea, no vomiting. No fevers, rash, arm pain. Occ coughing, no wheezing, CP, SOB, dysuria, hematuria, urgency, frequency, oderous or cloudy urine. No recent or remote h/o injury to this area. Denies IVDU. States she loads/carries around a heavy bag every day but no other change in physical activity. States tried tylenol and half of a friend's vicodin w/o relief yesterday. Also c/o ST, nasal congestion, postnasal drip starting yesterday.   Patient is a 50 y.o. female presenting with back pain. The history is provided by the patient.  Back Pain  This is a new problem. The current episode started more than 2 days ago. The problem occurs constantly. The problem has not changed since onset.The pain is associated with lifting heavy objects and twisting. The pain is present in the thoracic spine and lumbar spine. The quality of the pain is described as aching and stabbing. The pain does not radiate. The symptoms are aggravated by bending, twisting and certain positions. The pain is the same all the time. Pertinent negatives include no chest pain, no fever, no numbness, no abdominal pain, no abdominal swelling, no bowel incontinence, no bladder incontinence, no dysuria, no paresthesias, no paresis, no tingling and no weakness.    Past Medical History  Diagnosis Date  . Asthma   . Hypertension   . Anxiety   . CVA (cerebral infarction)     Past Surgical History  Procedure Date  . Knee arthroscopy   . Orthopedic surgery   . Cholecystectomy     Family History  Problem Relation Age of Onset  . Diabetes Mother   . Hypertension Mother   . Cancer  Father   . Hypertension Father   . Diabetes Father     History  Substance Use Topics  . Smoking status: Current Everyday Smoker    Types: Cigars  . Smokeless tobacco: Not on file  . Alcohol Use: Yes     socially    OB History    Grav Para Term Preterm Abortions TAB SAB Ect Mult Living                  Review of Systems  Constitutional: Negative for fever.  HENT: Positive for congestion, sore throat, rhinorrhea and postnasal drip. Negative for ear pain, trouble swallowing and voice change.   Respiratory: Negative for cough, shortness of breath and wheezing.   Cardiovascular: Negative for chest pain.  Gastrointestinal: Positive for nausea. Negative for vomiting, abdominal pain and bowel incontinence.  Genitourinary: Negative for bladder incontinence, dysuria, urgency, hematuria and flank pain.  Musculoskeletal: Positive for back pain.  Skin: Negative for rash.  Neurological: Negative for tingling, weakness, numbness and paresthesias.    Allergies  Codeine  Home Medications   Current Outpatient Rx  Name Route Sig Dispense Refill  . ALBUTEROL SULFATE HFA 108 (90 BASE) MCG/ACT IN AERS Inhalation Inhale 2 puffs into the lungs every 6 (six) hours as needed.      Marland Kitchen AMLODIPINE BESYLATE 10 MG PO TABS Oral Take 10 mg by mouth daily.      . ASPIRIN 325 MG PO TABS Oral Take  325 mg by mouth daily.      Marland Kitchen HYDROCHLOROTHIAZIDE 12.5 MG PO TABS Oral Take 12.5 mg by mouth daily.      Marland Kitchen HYDROCODONE-ACETAMINOPHEN 5-325 MG PO TABS  1 tablet.      Marland Kitchen ROSUVASTATIN CALCIUM 10 MG PO TABS Oral Take 10 mg by mouth daily.      Marland Kitchen HYDROCODONE-ACETAMINOPHEN 5-325 MG PO TABS Oral Take 2 tablets by mouth every 4 (four) hours as needed for pain. 20 tablet 0  . METHOCARBAMOL 500 MG PO TABS Oral Take 1 tablet (500 mg total) by mouth 4 (four) times daily. 40 tablet 0    BP 109/72  Pulse 87  Temp(Src) 98.8 F (37.1 C) (Oral)  Resp 18  SpO2 99%  LMP 12/09/2010  Physical Exam  Nursing note and vitals  reviewed. Constitutional: She is oriented to person, place, and time. She appears well-developed and well-nourished. No distress.  HENT:  Head: Normocephalic and atraumatic.  Right Ear: Tympanic membrane normal.  Left Ear: Tympanic membrane normal.  Nose: Mucosal edema and rhinorrhea present. Right sinus exhibits no maxillary sinus tenderness and no frontal sinus tenderness. Left sinus exhibits no maxillary sinus tenderness and no frontal sinus tenderness.  Mouth/Throat: Uvula is midline and mucous membranes are normal. Posterior oropharyngeal erythema present. No oropharyngeal exudate or posterior oropharyngeal edema.       Cobblestoned, irritated throat  Eyes: Conjunctivae and EOM are normal. Pupils are equal, round, and reactive to light.  Neck: Normal range of motion. Neck supple.  Cardiovascular: Normal rate, regular rhythm, normal heart sounds and intact distal pulses.   Pulmonary/Chest: Effort normal and breath sounds normal.  Abdominal: Soft. Bowel sounds are normal. She exhibits no distension and no mass. There is no hepatosplenomegaly or hepatomegaly. There is no tenderness. There is no rebound and no guarding.  Musculoskeletal: Normal range of motion.       Tenderness right lower thoracic, paralumbar area. Questionable CVA tenderness.  No cervical, thoracic, lumbar sacral bony tenderness. Pain worse with torso rotation to right, lateral bending, bending forward, and with lying down. Bilateral lower extremities nontender., baseline ROM with intact PT pulses, Sensation baseline light touch bilaterally for Pt, DTR's symmetric and intact bilaterally KJ, Motor symmetric bilateral 5/5 hip flexion, quadriceps, hamstrings, EHL, foot dorsiflexion, foot plantarflexion, gait normal.    Lymphadenopathy:    She has no cervical adenopathy.  Neurological: She is alert and oriented to person, place, and time.  Skin: Skin is warm and dry. No rash noted.  Psychiatric: She has a normal mood and affect.  Her behavior is normal. Judgment and thought content normal.    ED Course  Procedures (including critical care time)   Labs Reviewed  POCT URINALYSIS DIP (DEVICE)  POCT PREGNANCY, URINE  POCT URINALYSIS DIPSTICK  POCT PREGNANCY, URINE   No results found. Results for orders placed during the hospital encounter of 01/04/11  POCT URINALYSIS DIP (DEVICE)      Component Value Range   Glucose, UA NEGATIVE  NEGATIVE (mg/dL)   Bilirubin Urine NEGATIVE  NEGATIVE    Ketones, ur NEGATIVE  NEGATIVE (mg/dL)   Specific Gravity, Urine 1.025  1.005 - 1.030    Hgb urine dipstick NEGATIVE  NEGATIVE    pH 5.0  5.0 - 8.0    Protein, ur NEGATIVE  NEGATIVE (mg/dL)   Urobilinogen, UA 0.2  0.0 - 1.0 (mg/dL)   Nitrite NEGATIVE  NEGATIVE    Leukocytes, UA NEGATIVE  NEGATIVE   POCT PREGNANCY, URINE  Component Value Range   Preg Test, Ur NEGATIVE        1. Lumbar strain   2. URI (upper respiratory infection)       MDM  [place] narcotic database queried no record of narcotic rx in past year OTHER DATA REVIEWED: Nursing notes and vital signs reviewed. Prior records reviewed. Additional medical and social hx from this.  All results reviewed and discussed, questions answered, pt agreeable with plan.  Pt not hypoxic, lungs clear. Doubt pulmonary process causing sx. Denies IVDU although UA cocaine (+) on vist for stroke on 08/07/2010. No h/o fever, AF here. Infectious cause less likely. History not suggestive of cardiac cause. No evidence of spinal cord involvement. H&P most c/w MSK etiology of pain. Checking ua as pt with questionable R CVA tenderness. Abd soft, NT. Doubt UTI. Will tx as muscle strain, spasm and will have return if no improvement.   Luiz Blare, MD 01/04/11 [phone]

## 2011-01-04 NOTE — ED Notes (Signed)
Pt c/o mid back pain onset Monday.  States she felt nauseated on Sunday.  Denies urinary sx or injury to back. Tender to touch across mid back.  Also c/o headache, throat burning and runny nose onset yesterday and today.  Denies coughing much.

## 2011-03-17 ENCOUNTER — Encounter: Payer: Self-pay | Admitting: Internal Medicine

## 2011-03-17 DIAGNOSIS — I639 Cerebral infarction, unspecified: Secondary | ICD-10-CM | POA: Insufficient documentation

## 2011-03-17 DIAGNOSIS — E785 Hyperlipidemia, unspecified: Secondary | ICD-10-CM

## 2011-03-17 DIAGNOSIS — F1910 Other psychoactive substance abuse, uncomplicated: Secondary | ICD-10-CM

## 2011-03-17 DIAGNOSIS — F329 Major depressive disorder, single episode, unspecified: Secondary | ICD-10-CM | POA: Insufficient documentation

## 2011-03-17 DIAGNOSIS — F419 Anxiety disorder, unspecified: Secondary | ICD-10-CM | POA: Insufficient documentation

## 2011-03-17 DIAGNOSIS — G629 Polyneuropathy, unspecified: Secondary | ICD-10-CM | POA: Insufficient documentation

## 2011-03-17 DIAGNOSIS — J45909 Unspecified asthma, uncomplicated: Secondary | ICD-10-CM

## 2011-03-17 DIAGNOSIS — I340 Nonrheumatic mitral (valve) insufficiency: Secondary | ICD-10-CM

## 2011-03-17 DIAGNOSIS — I10 Essential (primary) hypertension: Secondary | ICD-10-CM | POA: Insufficient documentation

## 2011-03-17 DIAGNOSIS — D649 Anemia, unspecified: Secondary | ICD-10-CM | POA: Insufficient documentation

## 2011-03-17 DIAGNOSIS — Z0000 Encounter for general adult medical examination without abnormal findings: Secondary | ICD-10-CM | POA: Insufficient documentation

## 2011-03-17 DIAGNOSIS — F32A Depression, unspecified: Secondary | ICD-10-CM

## 2011-03-17 DIAGNOSIS — I351 Nonrheumatic aortic (valve) insufficiency: Secondary | ICD-10-CM

## 2011-03-17 HISTORY — DX: Nonrheumatic mitral (valve) insufficiency: I34.0

## 2011-03-17 HISTORY — DX: Polyneuropathy, unspecified: G62.9

## 2011-03-17 HISTORY — DX: Nonrheumatic aortic (valve) insufficiency: I35.1

## 2011-03-17 HISTORY — DX: Depression, unspecified: F32.A

## 2011-03-17 HISTORY — DX: Anemia, unspecified: D64.9

## 2011-03-17 HISTORY — DX: Other psychoactive substance abuse, uncomplicated: F19.10

## 2011-03-17 HISTORY — DX: Hyperlipidemia, unspecified: E78.5

## 2011-03-17 HISTORY — DX: Unspecified asthma, uncomplicated: J45.909

## 2011-03-22 ENCOUNTER — Ambulatory Visit: Payer: Medicare Other | Admitting: Internal Medicine

## 2011-04-26 ENCOUNTER — Other Ambulatory Visit (HOSPITAL_COMMUNITY): Payer: Self-pay | Admitting: Family Medicine

## 2011-04-26 DIAGNOSIS — Z1231 Encounter for screening mammogram for malignant neoplasm of breast: Secondary | ICD-10-CM

## 2011-05-09 ENCOUNTER — Encounter (HOSPITAL_COMMUNITY): Payer: Self-pay | Admitting: *Deleted

## 2011-05-09 ENCOUNTER — Emergency Department (INDEPENDENT_AMBULATORY_CARE_PROVIDER_SITE_OTHER)
Admission: EM | Admit: 2011-05-09 | Discharge: 2011-05-09 | Disposition: A | Discharge status: Home / Self Care | Payer: PRIVATE HEALTH INSURANCE | Source: Home / Self Care | Attending: Family Medicine | Admitting: Family Medicine

## 2011-05-09 DIAGNOSIS — J302 Other seasonal allergic rhinitis: Secondary | ICD-10-CM

## 2011-05-09 DIAGNOSIS — H1045 Other chronic allergic conjunctivitis: Secondary | ICD-10-CM

## 2011-05-09 DIAGNOSIS — H1010 Acute atopic conjunctivitis, unspecified eye: Secondary | ICD-10-CM

## 2011-05-09 MED ORDER — OLOPATADINE HCL 0.1 % OP SOLN: 1.0000 [drp] | Freq: Two times a day (BID) | OPHTHALMIC | Status: AC

## 2011-05-09 MED ORDER — FLUTICASONE PROPIONATE 50 MCG/ACT NA SUSP: 2.0000 | Freq: Every day | NASAL | Status: DC

## 2011-05-09 MED ORDER — METHYLPREDNISOLONE ACETATE 80 MG/ML IJ SUSP
INTRAMUSCULAR | Status: AC
  Filled 2011-05-09: qty 1

## 2011-05-09 MED ORDER — TRIAMCINOLONE ACETONIDE 40 MG/ML IJ SUSP
40.0000 mg | Freq: Once | INTRAMUSCULAR | Status: AC
  Administered 2011-05-09: 40 mg via INTRAMUSCULAR

## 2011-05-09 MED ORDER — METHYLPREDNISOLONE ACETATE 40 MG/ML IJ SUSP
80.0000 mg | Freq: Once | INTRAMUSCULAR | Status: AC
  Administered 2011-05-09: 80 mg via INTRAMUSCULAR

## 2011-05-09 MED ORDER — TRIAMCINOLONE ACETONIDE 40 MG/ML IJ SUSP
INTRAMUSCULAR | Status: AC
  Filled 2011-05-09: qty 5

## 2011-05-09 NOTE — Discharge Instructions (Signed)
Drink lots of fluids, continue your allegra, use medicine as prescribed for allergies, return as needed

## 2011-05-09 NOTE — ED Notes (Signed)
Pt   Reports  Symptoms  Of  Congested     Runny  Nose  Eye  Irritation    Nasal  stuffyness  -  She  Reports  Seasonal allergys  And  Has  Taken     Allegra  Without  releif       She  Is  Sitting  Upright on exam table  Speaking  In  Complete  sentances  And  Is  In no  Severe  Distress

## 2011-05-09 NOTE — ED Provider Notes (Addendum)
History     CSN: [id number]  Arrival date & time 05/09/11  1544   First MD Initiated Contact with Patient 05/09/11 1606      Chief Complaint  Patient presents with  . URI    (Consider location/radiation/quality/duration/timing/severity/associated sxs/prior treatment) Patient is a 51 y.o. female presenting with URI. The history is provided by the patient.  URI Primary symptoms do not include fever, sore throat, wheezing, nausea, vomiting, myalgias or rash. The current episode started more than 1 week ago. This is a chronic problem.  Symptoms associated with the illness include congestion and rhinorrhea.    Past Medical History  Diagnosis Date  . Asthma   . Hypertension   . Anxiety   . CVA (cerebral infarction)   . Unspecified asthma 03/17/2011  . Polysubstance abuse 03/17/2011  . Hyperlipidemia 03/17/2011  . Anemia, unspecified 03/17/2011  . Aortic regurgitation 03/17/2011  . Mitral valve regurgitation 03/17/2011  . Depression 03/17/2011  . Peripheral neuropathy 03/17/2011    Past Surgical History  Procedure Date  . Knee arthroscopy   . Orthopedic surgery   . Cholecystectomy     Family History  Problem Relation Age of Onset  . Diabetes Mother   . Hypertension Mother   . Cancer Father   . Hypertension Father   . Diabetes Father     History  Substance Use Topics  . Smoking status: Current Everyday Smoker    Types: Cigars  . Smokeless tobacco: Not on file  . Alcohol Use: Yes     socially    OB History    Grav Para Term Preterm Abortions TAB SAB Ect Mult Living                  Review of Systems  Constitutional: Negative for fever.  HENT: Positive for congestion, rhinorrhea, sneezing and postnasal drip. Negative for sore throat.   Respiratory: Negative for wheezing.   Gastrointestinal: Negative for nausea and vomiting.  Musculoskeletal: Negative for myalgias.  Skin: Negative for rash.    Allergies  Cymbalta; Klonopin; and Codeine  Home Medications    Current Outpatient Rx  Name Route Sig Dispense Refill  . ALBUTEROL SULFATE HFA 108 (90 BASE) MCG/ACT IN AERS Inhalation Inhale 2 puffs into the lungs every 6 (six) hours as needed.      Marland Kitchen AMLODIPINE BESYLATE 10 MG PO TABS Oral Take 10 mg by mouth daily.      . ASPIRIN 325 MG PO TABS Oral Take 325 mg by mouth daily.      Marland Kitchen FLUTICASONE PROPIONATE 50 MCG/ACT NA SUSP Nasal Place 2 sprays into the nose daily. 1 g 2  . HYDROCHLOROTHIAZIDE 12.5 MG PO TABS Oral Take 12.5 mg by mouth daily.      Marland Kitchen HYDROCODONE-ACETAMINOPHEN 5-325 MG PO TABS  1 tablet.      . OLOPATADINE HCL 0.1 % OP SOLN Both Eyes Place 1 drop into both eyes 2 (two) times daily. 5 mL 12  . ROSUVASTATIN CALCIUM 10 MG PO TABS Oral Take 10 mg by mouth daily.        BP 127/79  Pulse 90  Temp(Src) 98.7 F (37.1 C) (Oral)  Resp 18  SpO2 98%  Physical Exam  Nursing note and vitals reviewed. Constitutional: She is oriented to person, place, and time. She appears well-developed and well-nourished.  HENT:  Head: Normocephalic.  Right Ear: External ear normal.  Left Ear: External ear normal.  Nose: Mucosal edema and rhinorrhea present.  Mouth/Throat: Oropharynx is clear  and moist.  Eyes: Lids are normal. Right eye exhibits no exudate. Left eye exhibits no exudate. Right conjunctiva is injected. Left conjunctiva is injected.  Neck: Normal range of motion. Neck supple.  Cardiovascular: Normal rate and normal heart sounds.   Pulmonary/Chest: Effort normal and breath sounds normal.  Lymphadenopathy:    She has no cervical adenopathy.  Neurological: She is alert and oriented to person, place, and time.  Skin: Skin is warm and dry.  Psychiatric: She has a normal mood and affect.    ED Course  Procedures (including critical care time)  Labs Reviewed - No data to display No results found.   1. Seasonal allergic conjunctivitis   2. Seasonal allergic rhinitis       MDM          Linna Hoff, MD 05/09/11  1642  Linna Hoff, MD 05/09/11 [phone]

## 2011-05-19 ENCOUNTER — Ambulatory Visit (HOSPITAL_COMMUNITY)
Admission: RE | Admit: 2011-05-19 | Discharge: 2011-05-19 | Disposition: A | Discharge status: Home / Self Care | Payer: PRIVATE HEALTH INSURANCE | Source: Ambulatory Visit | Attending: Family Medicine | Admitting: Family Medicine

## 2011-05-19 DIAGNOSIS — Z1231 Encounter for screening mammogram for malignant neoplasm of breast: Secondary | ICD-10-CM

## 2012-02-12 ENCOUNTER — Encounter (HOSPITAL_COMMUNITY): Payer: Self-pay | Admitting: *Deleted

## 2012-02-12 ENCOUNTER — Emergency Department (INDEPENDENT_AMBULATORY_CARE_PROVIDER_SITE_OTHER)
Admission: EM | Admit: 2012-02-12 | Discharge: 2012-02-12 | Disposition: A | Discharge status: Home / Self Care | Payer: PRIVATE HEALTH INSURANCE | Source: Home / Self Care | Attending: Emergency Medicine | Admitting: Emergency Medicine

## 2012-02-12 DIAGNOSIS — J209 Acute bronchitis, unspecified: Secondary | ICD-10-CM

## 2012-02-12 MED ORDER — PREDNISONE 20 MG PO TABS: 20.0000 mg | ORAL_TABLET | Freq: Two times a day (BID) | ORAL | Status: DC

## 2012-02-12 MED ORDER — ALBUTEROL SULFATE HFA 108 (90 BASE) MCG/ACT IN AERS: 1.0000 | INHALATION_SPRAY | Freq: Four times a day (QID) | RESPIRATORY_TRACT | Status: DC | PRN

## 2012-02-12 MED ORDER — BENZONATATE 200 MG PO CAPS: 200.0000 mg | ORAL_CAPSULE | Freq: Three times a day (TID) | ORAL | Status: DC | PRN

## 2012-02-12 MED ORDER — AZITHROMYCIN 250 MG PO TABS: ORAL_TABLET | ORAL | Status: DC

## 2012-02-12 NOTE — ED Provider Notes (Signed)
Chief Complaint  Patient presents with  . URI    History of Present Illness:   Judith Mccullough  is a 52 year old female who has had a four-day history of cough productive of clear sputum and wheezing. She has a history of asthma and uses an albuterol inhaler as needed. She also smokes cigars. She has had associated symptoms of sore throat, nasal congestion with clear rhinorrhea, sinus pressure, left ear pain and pressure, aching in the rib cage, chills, and nausea. She is allergic to codeine. She takes amlodipine, Crestor, aspirin. She has a history of hypertension, hyperlipidemia, and the CVA.  Review of Systems:  Other than noted above, the patient denies any of the following symptoms. Systemic:  No fever, chills, sweats, fatigue, myalgias, headache, or anorexia. Eye:  No redness, pain or drainage. ENT:  No earache, ear congestion, nasal congestion, sneezing, rhinorrhea, sinus pressure, sinus pain, post nasal drip, or sore throat. Lungs:  No cough, sputum production, wheezing, shortness of breath, or chest pain. GI:  No abdominal pain, nausea, vomiting, or diarrhea.  PMFSH:  Past medical history, family history, social history, meds, and allergies were reviewed.  Physical Exam:   Vital signs:  BP 121/69  Pulse 89  Temp 99.7 F (37.6 C) (Oral)  Resp 20  SpO2 99% General:  Alert, in no distress. Eye:  No conjunctival injection or drainage. Lids were normal. ENT:  TMs and canals were normal, without erythema or inflammation.  Nasal mucosa was clear and uncongested, without drainage.  Mucous membranes were moist.  Pharynx was clear, without exudate or drainage.  There were no oral ulcerations or lesions. Neck:  Supple, no adenopathy, tenderness or mass. Lungs:  No respiratory distress.  Lungs were clear to auscultation, without wheezes, rales or rhonchi.  Breath sounds were clear and equal bilaterally.  Heart:  Regular rhythm, without gallops, murmers or rubs. Skin:  Clear, warm, and  dry, without rash or lesions.  Assessment:  The encounter diagnosis was Acute bronchitis.  Plan:   1.  The following meds were prescribed:   New Prescriptions   ALBUTEROL (PROVENTIL HFA;VENTOLIN HFA) 108 (90 BASE) MCG/ACT INHALER    Inhale 1-2 puffs into the lungs every 6 (six) hours as needed for wheezing.   AZITHROMYCIN (ZITHROMAX Z-PAK) 250 MG TABLET    Take as directed.   BENZONATATE (TESSALON) 200 MG CAPSULE    Take 1 capsule (200 mg total) by mouth 3 (three) times daily as needed for cough.   PREDNISONE (DELTASONE) 20 MG TABLET    Take 1 tablet (20 mg total) by mouth 2 (two) times daily.   2.  The patient was instructed in symptomatic care and handouts were given. 3.  The patient was told to return if becoming worse in any way, if no better in 3 or 4 days, and given some red flag symptoms that would indicate earlier return.   Reuben Likes, MD 02/12/12 1101

## 2012-02-12 NOTE — ED Notes (Signed)
Pt reports uri symptoms for the past four days  - coughing, body aches and fever - no relief from otc

## 2012-04-17 ENCOUNTER — Encounter (HOSPITAL_COMMUNITY): Payer: Self-pay | Admitting: *Deleted

## 2012-04-17 ENCOUNTER — Emergency Department (INDEPENDENT_AMBULATORY_CARE_PROVIDER_SITE_OTHER)
Admission: EM | Admit: 2012-04-17 | Discharge: 2012-04-17 | Disposition: A | Discharge status: Home / Self Care | Payer: Medicaid Other | Source: Home / Self Care | Attending: Emergency Medicine | Admitting: Emergency Medicine

## 2012-04-17 DIAGNOSIS — J302 Other seasonal allergic rhinitis: Secondary | ICD-10-CM

## 2012-04-17 DIAGNOSIS — J309 Allergic rhinitis, unspecified: Secondary | ICD-10-CM

## 2012-04-17 MED ORDER — FLUTICASONE PROPIONATE 50 MCG/ACT NA SUSP: 2.0000 | Freq: Every day | NASAL | Status: DC

## 2012-04-17 MED ORDER — FEXOFENADINE HCL 60 MG PO TABS: 60.0000 mg | ORAL_TABLET | Freq: Two times a day (BID) | ORAL | Status: DC

## 2012-04-17 MED ORDER — FEXOFENADINE-PSEUDOEPHED ER 60-120 MG PO TB12: 1.0000 | ORAL_TABLET | Freq: Two times a day (BID) | ORAL | Status: AC

## 2012-04-17 NOTE — ED Notes (Signed)
Hx. of allergies.  C/o headache off and on  X 1 week. (Saturday,  Monday, and today).  C/o earache onset last Saturday.  Also c/o sneezing, runny nose, itching eyes, and itching throat.

## 2012-04-17 NOTE — ED Provider Notes (Addendum)
History     CSN: [id number]  Arrival date & time 04/17/12  1747   First MD Initiated Contact with Patient 04/17/12 1753      No chief complaint on file.   (Consider location/radiation/quality/duration/timing/severity/associated sxs/prior treatment) HPI Comments: Patient presents urgent care describing for several weeks she's been having allergy related symptoms with itchy eyes runny nose postnasal drainage sneezing and a burning in throat. No fevers no shortness of breath no nausea vomiting does have some sinus congestion.  The history is provided by the patient.    Past Medical History  Diagnosis Date  . Asthma   . Hypertension   . Anxiety   . CVA (cerebral infarction)   . Unspecified asthma 03/17/2011  . Polysubstance abuse 03/17/2011  . Hyperlipidemia 03/17/2011  . Anemia, unspecified 03/17/2011  . Aortic regurgitation 03/17/2011  . Mitral valve regurgitation 03/17/2011  . Depression 03/17/2011  . Peripheral neuropathy 03/17/2011    Past Surgical History  Procedure Laterality Date  . Knee arthroscopy    . Orthopedic surgery    . Cholecystectomy      Family History  Problem Relation Age of Onset  . Diabetes Mother   . Hypertension Mother   . Cancer Father   . Hypertension Father   . Diabetes Father     History  Substance Use Topics  . Smoking status: Current Every Day Smoker    Types: Cigars  . Smokeless tobacco: Not on file  . Alcohol Use: Yes     Comment: socially    OB History   Grav Para Term Preterm Abortions TAB SAB Ect Mult Living                  Review of Systems  Constitutional: Negative for fever, activity change, appetite change, fatigue and unexpected weight change.  HENT: Positive for ear pain, congestion, rhinorrhea, sneezing and postnasal drip. Negative for facial swelling, neck pain and neck stiffness.   Eyes: Positive for redness and itching. Negative for discharge.  Respiratory: Positive for cough. Negative for shortness of breath, wheezing and  stridor.   Skin: Negative for color change and pallor.  Allergic/Immunologic: Positive for environmental allergies. Negative for food allergies and immunocompromised state.  Neurological: Negative for dizziness, weakness, numbness and headaches.    Allergies  Cymbalta; Klonopin; and Codeine  Home Medications   Current Outpatient Rx  Name  Route  Sig  Dispense  Refill  . albuterol (PROVENTIL HFA;VENTOLIN HFA) 108 (90 BASE) MCG/ACT inhaler   Inhalation   Inhale 2 puffs into the lungs every 6 (six) hours as needed.           Marland Kitchen albuterol (PROVENTIL HFA;VENTOLIN HFA) 108 (90 BASE) MCG/ACT inhaler   Inhalation   Inhale 1-2 puffs into the lungs every 6 (six) hours as needed for wheezing.   1 Inhaler   0   . amLODipine (NORVASC) 10 MG tablet   Oral   Take 10 mg by mouth daily.           Marland Kitchen aspirin 325 MG tablet   Oral   Take 325 mg by mouth daily.           Marland Kitchen azithromycin (ZITHROMAX Z-PAK) 250 MG tablet      Take as directed.   6 tablet   0   . benzonatate (TESSALON) 200 MG capsule   Oral   Take 1 capsule (200 mg total) by mouth 3 (three) times daily as needed for cough.   30 capsule  0   . fexofenadine (ALLEGRA) 60 MG tablet   Oral   Take 1 tablet (60 mg total) by mouth 2 (two) times daily.   30 tablet   1   . fexofenadine (ALLEGRA) 60 MG tablet   Oral   Take 1 tablet (60 mg total) by mouth 2 (two) times daily.   30 tablet   2   . fexofenadine-pseudoephedrine (ALLEGRA-D) 60-120 MG per tablet   Oral   Take 1 tablet by mouth 2 (two) times daily.   10 tablet   0   . fluticasone (FLONASE) 50 MCG/ACT nasal spray   Nasal   Place 2 sprays into the nose daily.   1 g   2   . fluticasone (FLONASE) 50 MCG/ACT nasal spray   Nasal   Place 2 sprays into the nose daily.   16 g   2   . fluticasone (FLONASE) 50 MCG/ACT nasal spray   Nasal   Place 2 sprays into the nose daily.   16 g   2   . hydrochlorothiazide (HYDRODIURIL) 12.5 MG tablet   Oral   Take  12.5 mg by mouth daily.           Marland Kitchen HYDROcodone-acetaminophen (NORCO) 5-325 MG per tablet      1 tablet.           Marland Kitchen olopatadine (PATANOL) 0.1 % ophthalmic solution   Both Eyes   Place 1 drop into both eyes 2 (two) times daily.   5 mL   12   . predniSONE (DELTASONE) 20 MG tablet   Oral   Take 1 tablet (20 mg total) by mouth 2 (two) times daily.   10 tablet   0   . rosuvastatin (CRESTOR) 10 MG tablet   Oral   Take 10 mg by mouth daily.             BP 123/79  Pulse 99  Temp(Src) 98.6 F (37 C) (Oral)  Resp 18  SpO2 97%  Physical Exam  Nursing note and vitals reviewed. Constitutional: Vital signs are normal. She appears well-developed and well-nourished.  Non-toxic appearance. She does not have a sickly appearance. No distress.  HENT:  Head: Normocephalic.  Right Ear: Tympanic membrane normal.  Left Ear: Tympanic membrane normal.  Nose: Rhinorrhea present. No mucosal edema.  Mouth/Throat: Uvula is midline. Posterior oropharyngeal erythema present. No oropharyngeal exudate, posterior oropharyngeal edema or tonsillar abscesses.  Eyes: Right eye exhibits no discharge. Left eye exhibits no discharge. No scleral icterus.  Neck: No JVD present.  Pulmonary/Chest: Effort normal and breath sounds normal. She has no decreased breath sounds.  Neurological: She is alert.  Skin: No rash noted. No erythema.    ED Course  Procedures (including critical care time)  Labs Reviewed - No data to display No results found.   1. Seasonal allergic rhinitis       MDM  Symptoms and exam consistent with seasonal allergies patient had been prescribed Allegra-D to be used for 5 days and then to switch to Allegra. Patient is currently using Flonase and have been provided with a refill of her nasal Flonase        Jimmie Molly, MD 04/17/12 4540  Jimmie Molly, MD 04/17/12 1840

## 2012-07-25 ENCOUNTER — Other Ambulatory Visit (HOSPITAL_COMMUNITY): Payer: Self-pay | Admitting: Family Medicine

## 2012-07-25 DIAGNOSIS — Z1231 Encounter for screening mammogram for malignant neoplasm of breast: Secondary | ICD-10-CM

## 2012-07-30 ENCOUNTER — Emergency Department (HOSPITAL_COMMUNITY)
Admission: EM | Admit: 2012-07-30 | Discharge: 2012-07-31 | Disposition: A | Discharge status: Home / Self Care | Payer: PRIVATE HEALTH INSURANCE | Attending: Emergency Medicine | Admitting: Emergency Medicine

## 2012-07-30 ENCOUNTER — Inpatient Hospital Stay (HOSPITAL_COMMUNITY)
Admission: AD | Admit: 2012-07-30 | Discharge: 2012-07-30 | Discharge status: Against Medical Advice | Payer: PRIVATE HEALTH INSURANCE | Source: Ambulatory Visit | Attending: Family Medicine | Admitting: Family Medicine

## 2012-07-30 DIAGNOSIS — J45909 Unspecified asthma, uncomplicated: Secondary | ICD-10-CM | POA: Insufficient documentation

## 2012-07-30 DIAGNOSIS — R109 Unspecified abdominal pain: Secondary | ICD-10-CM | POA: Insufficient documentation

## 2012-07-30 DIAGNOSIS — D259 Leiomyoma of uterus, unspecified: Secondary | ICD-10-CM | POA: Insufficient documentation

## 2012-07-30 DIAGNOSIS — F17200 Nicotine dependence, unspecified, uncomplicated: Secondary | ICD-10-CM | POA: Insufficient documentation

## 2012-07-30 DIAGNOSIS — Z8669 Personal history of other diseases of the nervous system and sense organs: Secondary | ICD-10-CM | POA: Insufficient documentation

## 2012-07-30 DIAGNOSIS — K5792 Diverticulitis of intestine, part unspecified, without perforation or abscess without bleeding: Secondary | ICD-10-CM

## 2012-07-30 DIAGNOSIS — Z8659 Personal history of other mental and behavioral disorders: Secondary | ICD-10-CM | POA: Insufficient documentation

## 2012-07-30 DIAGNOSIS — Z79899 Other long term (current) drug therapy: Secondary | ICD-10-CM | POA: Insufficient documentation

## 2012-07-30 DIAGNOSIS — IMO0002 Reserved for concepts with insufficient information to code with codable children: Secondary | ICD-10-CM | POA: Insufficient documentation

## 2012-07-30 DIAGNOSIS — Z9889 Other specified postprocedural states: Secondary | ICD-10-CM | POA: Insufficient documentation

## 2012-07-30 DIAGNOSIS — E785 Hyperlipidemia, unspecified: Secondary | ICD-10-CM | POA: Insufficient documentation

## 2012-07-30 DIAGNOSIS — Z8679 Personal history of other diseases of the circulatory system: Secondary | ICD-10-CM | POA: Insufficient documentation

## 2012-07-30 DIAGNOSIS — Z8673 Personal history of transient ischemic attack (TIA), and cerebral infarction without residual deficits: Secondary | ICD-10-CM | POA: Insufficient documentation

## 2012-07-30 DIAGNOSIS — K5732 Diverticulitis of large intestine without perforation or abscess without bleeding: Secondary | ICD-10-CM | POA: Insufficient documentation

## 2012-07-30 DIAGNOSIS — N920 Excessive and frequent menstruation with regular cycle: Secondary | ICD-10-CM | POA: Insufficient documentation

## 2012-07-30 DIAGNOSIS — Z862 Personal history of diseases of the blood and blood-forming organs and certain disorders involving the immune mechanism: Secondary | ICD-10-CM | POA: Insufficient documentation

## 2012-07-30 DIAGNOSIS — I10 Essential (primary) hypertension: Secondary | ICD-10-CM | POA: Insufficient documentation

## 2012-07-30 DIAGNOSIS — Z7982 Long term (current) use of aspirin: Secondary | ICD-10-CM | POA: Insufficient documentation

## 2012-07-30 LAB — URINALYSIS, ROUTINE W REFLEX MICROSCOPIC
Bilirubin Urine: NEGATIVE
Glucose, UA: NEGATIVE mg/dL
Ketones, ur: NEGATIVE mg/dL
Leukocytes, UA: NEGATIVE
Nitrite: NEGATIVE
Protein, ur: NEGATIVE mg/dL
Specific Gravity, Urine: 1.025 (ref 1.005–1.030)
Urobilinogen, UA: 0.2 mg/dL (ref 0.0–1.0)
pH: 6 (ref 5.0–8.0)

## 2012-07-30 LAB — POCT I-STAT, CHEM 8
BUN: 14 mg/dL (ref 6–23)
Calcium, Ion: 1.2 mmol/L (ref 1.12–1.23)
Chloride: 106 mEq/L (ref 96–112)
Creatinine, Ser: 1.3 mg/dL — ABNORMAL HIGH (ref 0.50–1.10)
Glucose, Bld: 99 mg/dL (ref 70–99)
HCT: 40 % (ref 36.0–46.0)
Hemoglobin: 13.6 g/dL (ref 12.0–15.0)
Potassium: 4 mEq/L (ref 3.5–5.1)
Sodium: 138 mEq/L (ref 135–145)
TCO2: 21 mmol/L (ref 0–100)

## 2012-07-30 LAB — CBC WITH DIFFERENTIAL/PLATELET
Basophils Absolute: 0 10*3/uL (ref 0.0–0.1)
Basophils Relative: 0 % (ref 0–1)
Eosinophils Absolute: 0.3 10*3/uL (ref 0.0–0.7)
Eosinophils Relative: 3 % (ref 0–5)
HCT: 37 % (ref 36.0–46.0)
Hemoglobin: 12.7 g/dL (ref 12.0–15.0)
Lymphocytes Relative: 27 % (ref 12–46)
Lymphs Abs: 3.5 10*3/uL (ref 0.7–4.0)
MCH: 27.6 pg (ref 26.0–34.0)
MCHC: 34.3 g/dL (ref 30.0–36.0)
MCV: 80.4 fL (ref 78.0–100.0)
Monocytes Absolute: 0.7 10*3/uL (ref 0.1–1.0)
Monocytes Relative: 6 % (ref 3–12)
Neutro Abs: 8.4 10*3/uL — ABNORMAL HIGH (ref 1.7–7.7)
Neutrophils Relative %: 65 % (ref 43–77)
Platelets: 337 10*3/uL (ref 150–400)
RBC: 4.6 MIL/uL (ref 3.87–5.11)
RDW: 16.8 % — ABNORMAL HIGH (ref 11.5–15.5)
WBC: 12.9 10*3/uL — ABNORMAL HIGH (ref 4.0–10.5)

## 2012-07-30 LAB — URINE MICROSCOPIC-ADD ON

## 2012-07-30 LAB — POCT PREGNANCY, URINE: Preg Test, Ur: NEGATIVE

## 2012-07-30 MED ORDER — ONDANSETRON HCL 4 MG/2ML IJ SOLN
4.0000 mg | Freq: Once | INTRAMUSCULAR | Status: AC
  Administered 2012-07-30: 4 mg via INTRAVENOUS
  Filled 2012-07-30: qty 2

## 2012-07-30 MED ORDER — FENTANYL CITRATE 0.05 MG/ML IJ SOLN
50.0000 ug | INTRAMUSCULAR | Status: DC | PRN
  Administered 2012-07-30: 50 ug via INTRAVENOUS
  Filled 2012-07-30: qty 2

## 2012-07-30 MED ORDER — IOHEXOL 300 MG/ML  SOLN
25.0000 mL | INTRAMUSCULAR | Status: AC
  Administered 2012-07-30: 25 mL via ORAL

## 2012-07-30 MED ORDER — SODIUM CHLORIDE 0.9 % IV SOLN
INTRAVENOUS | Status: DC
  Administered 2012-07-30: via INTRAVENOUS

## 2012-07-30 NOTE — ED Provider Notes (Signed)
History    CSN: [id number] Arrival date & time 07/30/12  2255  First MD Initiated Contact with Patient 07/30/12 2316     Chief Complaint  Patient presents with  . Abdominal Pain   (Consider location/radiation/quality/duration/timing/severity/associated sxs/prior Treatment) HPI Hx per PT - RLQ more so than LLQ ABD pain onset 2 days ago with menses worse today, states heavy period, has known fibroids. Pain sharp and now mod to severe, denies h/o same. No F/C. No N/V/D. No flank pain. Symptoms constant and progressively worsening  Past Medical History  Diagnosis Date  . Asthma   . Hypertension   . Anxiety   . CVA (cerebral infarction)   . Unspecified asthma 03/17/2011  . Polysubstance abuse 03/17/2011    States she has never had problems with drugs.  . Hyperlipidemia 03/17/2011  . Anemia, unspecified 03/17/2011  . Aortic regurgitation 03/17/2011    Pt. denies problem with her heart.  . Mitral valve regurgitation 03/17/2011    Pt. said she never had problem with her heart  . Depression 03/17/2011  . Peripheral neuropathy 03/17/2011    Pt. denies this problem   Past Surgical History  Procedure Laterality Date  . Knee arthroscopy    . Orthopedic surgery    . Cholecystectomy    . Thumb surgery Right 2003    R thumb reconstruction   Family History  Problem Relation Age of Onset  . Diabetes Mother   . Hypertension Mother   . Cancer Father   . Hypertension Father   . Diabetes Father    History  Substance Use Topics  . Smoking status: Current Every Day Smoker    Types: Cigars  . Smokeless tobacco: Not on file  . Alcohol Use: No   OB History   Grav Para Term Preterm Abortions TAB SAB Ect Mult Living                 Review of Systems  Constitutional: Negative for fever and chills.  HENT: Negative for neck pain and neck stiffness.   Eyes: Negative for pain.  Respiratory: Negative for shortness of breath.   Cardiovascular: Negative for chest pain.  Gastrointestinal: Positive  for abdominal pain. Negative for blood in stool.  Genitourinary: Negative for dysuria and flank pain.  Musculoskeletal: Negative for back pain.  Skin: Negative for rash.  Neurological: Negative for headaches.  All other systems reviewed and are negative.    Allergies  Cymbalta; Klonopin; and Codeine  Home Medications   Current Outpatient Rx  Name  Route  Sig  Dispense  Refill  . albuterol (PROVENTIL HFA;VENTOLIN HFA) 108 (90 BASE) MCG/ACT inhaler   Inhalation   Inhale 2 puffs into the lungs every 6 (six) hours as needed.           Marland Kitchen albuterol (PROVENTIL HFA;VENTOLIN HFA) 108 (90 BASE) MCG/ACT inhaler   Inhalation   Inhale 1-2 puffs into the lungs every 6 (six) hours as needed for wheezing.   1 Inhaler   0   . amLODipine (NORVASC) 10 MG tablet   Oral   Take 10 mg by mouth daily.           Marland Kitchen aspirin 325 MG tablet   Oral   Take 325 mg by mouth daily.           Marland Kitchen azithromycin (ZITHROMAX Z-PAK) 250 MG tablet      Take as directed.   6 tablet   0   . benzonatate (TESSALON) 200 MG capsule  Oral   Take 1 capsule (200 mg total) by mouth 3 (three) times daily as needed for cough.   30 capsule   0   . EXPIRED: fexofenadine (ALLEGRA) 60 MG tablet   Oral   Take 1 tablet (60 mg total) by mouth 2 (two) times daily.   30 tablet   1   . fexofenadine (ALLEGRA) 60 MG tablet   Oral   Take 1 tablet (60 mg total) by mouth 2 (two) times daily.   30 tablet   2   . EXPIRED: fluticasone (FLONASE) 50 MCG/ACT nasal spray   Nasal   Place 2 sprays into the nose daily.   1 g   2   . EXPIRED: fluticasone (FLONASE) 50 MCG/ACT nasal spray   Nasal   Place 2 sprays into the nose daily.   16 g   2   . EXPIRED: fluticasone (FLONASE) 50 MCG/ACT nasal spray   Nasal   Place 2 sprays into the nose daily.   16 g   2   . hydrochlorothiazide (HYDRODIURIL) 12.5 MG tablet   Oral   Take 12.5 mg by mouth daily.           Marland Kitchen HYDROcodone-acetaminophen (NORCO) 5-325 MG per  tablet      1 tablet.           . predniSONE (DELTASONE) 20 MG tablet   Oral   Take 1 tablet (20 mg total) by mouth 2 (two) times daily.   10 tablet   0   . rosuvastatin (CRESTOR) 10 MG tablet   Oral   Take 10 mg by mouth daily.            BP 123/81  Pulse 108  Temp(Src) 98.9 F (37.2 C) (Oral)  Resp 20  SpO2 100%  LMP 07/27/2012 Physical Exam  Nursing note and vitals reviewed. Constitutional: She is oriented to person, place, and time. She appears well-developed and well-nourished.  HENT:  Head: Normocephalic and atraumatic.  Eyes: EOM are normal. Pupils are equal, round, and reactive to light.  Neck: Neck supple.  Cardiovascular: Normal rate, normal heart sounds and intact distal pulses.   Pulmonary/Chest: Effort normal and breath sounds normal. No respiratory distress.  Abdominal: Soft. Bowel sounds are normal. She exhibits no distension. There is no rebound and no guarding.  TTP RLQ > LLQ  Musculoskeletal: Normal range of motion. She exhibits no edema.  Neurological: She is alert and oriented to person, place, and time.  Skin: Skin is warm and dry.    ED Course  Procedures (including critical care time)  Results for orders placed during the hospital encounter of 07/30/12  COMPREHENSIVE METABOLIC PANEL      Result Value Range   Sodium 136  135 - 145 mEq/L   Potassium 4.2  3.5 - 5.1 mEq/L   Chloride 103  96 - 112 mEq/L   CO2 22  19 - 32 mEq/L   Glucose, Bld 98  70 - 99 mg/dL   BUN 14  6 - 23 mg/dL   Creatinine, Ser 7.82 (*) 0.50 - 1.10 mg/dL   Calcium 9.4  8.4 - 95.6 mg/dL   Total Protein 7.1  6.0 - 8.3 g/dL   Albumin 3.3 (*) 3.5 - 5.2 g/dL   AST 21  0 - 37 U/L   ALT 23  0 - 35 U/L   Alkaline Phosphatase 72  39 - 117 U/L   Total Bilirubin 0.2 (*) 0.3 - 1.2 mg/dL  GFR calc non Af Amer 47 (*) >90 mL/min   GFR calc Af Amer 54 (*) >90 mL/min  CBC WITH DIFFERENTIAL      Result Value Range   WBC 12.9 (*) 4.0 - 10.5 K/uL   RBC 4.60  3.87 - 5.11 MIL/uL    Hemoglobin 12.7  12.0 - 15.0 g/dL   HCT 16.1  09.6 - 04.5 %   MCV 80.4  78.0 - 100.0 fL   MCH 27.6  26.0 - 34.0 pg   MCHC 34.3  30.0 - 36.0 g/dL   RDW 40.9 (*) 81.1 - 91.4 %   Platelets 337  150 - 400 K/uL   Neutrophils Relative % 65  43 - 77 %   Neutro Abs 8.4 (*) 1.7 - 7.7 K/uL   Lymphocytes Relative 27  12 - 46 %   Lymphs Abs 3.5  0.7 - 4.0 K/uL   Monocytes Relative 6  3 - 12 %   Monocytes Absolute 0.7  0.1 - 1.0 K/uL   Eosinophils Relative 3  0 - 5 %   Eosinophils Absolute 0.3  0.0 - 0.7 K/uL   Basophils Relative 0  0 - 1 %   Basophils Absolute 0.0  0.0 - 0.1 K/uL  LIPASE, BLOOD      Result Value Range   Lipase 25  11 - 59 U/L  POCT I-STAT, CHEM 8      Result Value Range   Sodium 138  135 - 145 mEq/L   Potassium 4.0  3.5 - 5.1 mEq/L   Chloride 106  96 - 112 mEq/L   BUN 14  6 - 23 mg/dL   Creatinine, Ser 7.82 (*) 0.50 - 1.10 mg/dL   Glucose, Bld 99  70 - 99 mg/dL   Calcium, Ion 9.56  1.12 - 1.23 mmol/L   TCO2 21  0 - 100 mmol/L   Hemoglobin 13.6  12.0 - 15.0 g/dL   HCT 21.3  08.6 - 57.8 %  POCT PREGNANCY, URINE      Result Value Range   Preg Test, Ur NEGATIVE  NEGATIVE   Ct Abdomen Pelvis W Contrast  07/31/2012   *RADIOLOGY REPORT*  Clinical Data: Bilateral lower abdominal pain.  CT ABDOMEN AND PELVIS WITH CONTRAST  Technique:  Multidetector CT imaging of the abdomen and pelvis was performed following the standard protocol during bolus administration of intravenous contrast.  Contrast: OMNIPAQUE IOHEXOL 300 MG/ML  SOLN  Comparison: Ultrasound 11/02/2009  Findings: Emphysematous changes noted at the lung bases.  No evidence for free air.  Normal appearance of the liver and portal venous system.  The gallbladder has been removed.  Normal appearance of the spleen, pancreas and adrenal glands.  No acute abnormalities in the kidneys but there is scarring in the right renal cortex.  There is a large heterogeneous structure along the posterior uterus that is suggestive for a  large fibroid.  This fibroid measures up to 6.4 cm.  There is focal wall thickening of the sigmoid colon with adjacent fluid.  There are multiple small diverticula involving the left colon.  Normal appearance of the appendix. Fluid in the urinary bladder.  No acute bony abnormality.  IMPRESSION:  Colonic wall thickening with surrounding inflammatory changes near the junction of sigmoid colon and descending colon.  Findings are consistent with focal colitis.  The patient has scattered colonic diverticula and these inflammatory changes are most likely associated with acute diverticulitis.  However, a neoplastic process cannot be excluded and recommend  GI follow-up.  No evidence for an abscess collection.  Right renal cortical scarring.  Large uterine fibroid.   Original Report Authenticated By: Richarda Overlie, M.D.    IVFs IV fentanyl, IV zofran  2:57 AM tolerates by mouth antibiotics, Cipro and Flagyl. Still having some mild pain and IV Dilaudid ordered. Patient feels comfortable with plan discharge home with strict return precautions for any fevers, vomiting or severe pain. She has seen OB/GYN Dr Estanislado Pandy in the past and agrees to followup for fibroid uterus and heavy bleeding. She will follow up with GI for diverticular disease. Prescription for antibiotics, anti-emetics and pain medications provided. Stable for discharge home at this time  MDM  Lower abdominal pain with heavy vaginal bleeding and known fibroid  Improved with IV fluids and IV narcotics  Evaluated with labs, urinalysis and imaging all reviewed as above. No anemia. Mildly elevated creatinine.  CT scan reviewed as above and patient treated for diverticulitis. Tolerates oral antibiotics.  Vital signs and nursing notes reviewed and considered  Sunnie Nielsen, MD 07/31/12 0300

## 2012-07-30 NOTE — MAU Note (Signed)
Pt signed AMA form. Pt understands the risks and states she will visit primary care physician tomorrow.

## 2012-07-30 NOTE — ED Notes (Signed)
Pt c/o bilateral lower abdominal starting Saturday with her menses. Pain progressively worsening.

## 2012-07-30 NOTE — MAU Note (Signed)
Patient states she started her period on 7-12 and has been having abdominal cramping and heavier bleeding than usual.

## 2012-07-31 ENCOUNTER — Encounter (HOSPITAL_COMMUNITY): Payer: Self-pay | Admitting: Radiology

## 2012-07-31 ENCOUNTER — Emergency Department (HOSPITAL_COMMUNITY): Payer: PRIVATE HEALTH INSURANCE

## 2012-07-31 LAB — COMPREHENSIVE METABOLIC PANEL
ALT: 23 U/L (ref 0–35)
AST: 21 U/L (ref 0–37)
Albumin: 3.3 g/dL — ABNORMAL LOW (ref 3.5–5.2)
Alkaline Phosphatase: 72 U/L (ref 39–117)
BUN: 14 mg/dL (ref 6–23)
CO2: 22 mEq/L (ref 19–32)
Calcium: 9.4 mg/dL (ref 8.4–10.5)
Chloride: 103 mEq/L (ref 96–112)
Creatinine, Ser: 1.31 mg/dL — ABNORMAL HIGH (ref 0.50–1.10)
GFR calc Af Amer: 54 mL/min — ABNORMAL LOW (ref >90)
GFR calc non Af Amer: 47 mL/min — ABNORMAL LOW (ref >90)
Glucose, Bld: 98 mg/dL (ref 70–99)
Potassium: 4.2 mEq/L (ref 3.5–5.1)
Sodium: 136 mEq/L (ref 135–145)
Total Bilirubin: 0.2 mg/dL — ABNORMAL LOW (ref 0.3–1.2)
Total Protein: 7.1 g/dL (ref 6.0–8.3)

## 2012-07-31 LAB — URINALYSIS, ROUTINE W REFLEX MICROSCOPIC
Bilirubin Urine: NEGATIVE
Glucose, UA: NEGATIVE mg/dL
Ketones, ur: NEGATIVE mg/dL
Nitrite: NEGATIVE
Protein, ur: NEGATIVE mg/dL
Specific Gravity, Urine: 1.028 (ref 1.005–1.030)
Urobilinogen, UA: 0.2 mg/dL (ref 0.0–1.0)
pH: 5 (ref 5.0–8.0)

## 2012-07-31 LAB — URINE MICROSCOPIC-ADD ON

## 2012-07-31 LAB — LIPASE, BLOOD: Lipase: 25 U/L (ref 11–59)

## 2012-07-31 LAB — POCT PREGNANCY, URINE: Preg Test, Ur: NEGATIVE

## 2012-07-31 MED ORDER — OXYCODONE-ACETAMINOPHEN 5-325 MG PO TABS: 2.0000 | ORAL_TABLET | ORAL | Status: DC | PRN

## 2012-07-31 MED ORDER — IOHEXOL 300 MG/ML  SOLN
100.0000 mL | Freq: Once | INTRAMUSCULAR | Status: AC | PRN
  Administered 2012-07-31: 100 mL via INTRAVENOUS

## 2012-07-31 MED ORDER — HYDROMORPHONE HCL PF 1 MG/ML IJ SOLN
1.0000 mg | Freq: Once | INTRAMUSCULAR | Status: AC
  Administered 2012-07-31: 1 mg via INTRAVENOUS
  Filled 2012-07-31: qty 1

## 2012-07-31 MED ORDER — CIPROFLOXACIN HCL 500 MG PO TABS
500.0000 mg | ORAL_TABLET | Freq: Once | ORAL | Status: AC
  Administered 2012-07-31: 500 mg via ORAL
  Filled 2012-07-31: qty 1

## 2012-07-31 MED ORDER — PROMETHAZINE HCL 25 MG PO TABS: 25.0000 mg | ORAL_TABLET | Freq: Four times a day (QID) | ORAL | Status: DC | PRN

## 2012-07-31 MED ORDER — METRONIDAZOLE 500 MG PO TABS: 500.0000 mg | ORAL_TABLET | Freq: Two times a day (BID) | ORAL | Status: DC

## 2012-07-31 MED ORDER — CIPROFLOXACIN HCL 500 MG PO TABS: 500.0000 mg | ORAL_TABLET | Freq: Two times a day (BID) | ORAL | Status: DC

## 2012-07-31 MED ORDER — METRONIDAZOLE 500 MG PO TABS
500.0000 mg | ORAL_TABLET | Freq: Once | ORAL | Status: AC
  Administered 2012-07-31: 500 mg via ORAL
  Filled 2012-07-31: qty 1

## 2012-07-31 NOTE — ED Notes (Signed)
Pt dc to home. Pt sts understanding to dc instructions. Pt ambulatory to exit without difficulty. 

## 2012-08-07 ENCOUNTER — Ambulatory Visit (HOSPITAL_COMMUNITY)
Admission: RE | Admit: 2012-08-07 | Discharge: 2012-08-07 | Disposition: A | Discharge status: Home / Self Care | Payer: PRIVATE HEALTH INSURANCE | Source: Ambulatory Visit | Attending: Family Medicine | Admitting: Family Medicine

## 2012-08-07 DIAGNOSIS — Z1231 Encounter for screening mammogram for malignant neoplasm of breast: Secondary | ICD-10-CM | POA: Insufficient documentation

## 2012-08-09 ENCOUNTER — Encounter: Payer: Self-pay | Admitting: *Deleted

## 2012-08-09 ENCOUNTER — Encounter: Payer: Self-pay | Admitting: Gastroenterology

## 2012-08-20 ENCOUNTER — Ambulatory Visit: Payer: Medicare Other | Admitting: Gastroenterology

## 2012-09-06 ENCOUNTER — Ambulatory Visit: Payer: Medicare Other | Admitting: Gastroenterology

## 2012-11-18 ENCOUNTER — Ambulatory Visit (HOSPITAL_COMMUNITY): Payer: Medicare Other | Admitting: Psychiatry

## 2013-03-27 ENCOUNTER — Other Ambulatory Visit: Payer: Self-pay | Admitting: Gastroenterology

## 2013-08-04 ENCOUNTER — Encounter (HOSPITAL_COMMUNITY): Payer: Self-pay | Admitting: Emergency Medicine

## 2013-08-04 ENCOUNTER — Emergency Department (INDEPENDENT_AMBULATORY_CARE_PROVIDER_SITE_OTHER)
Admission: EM | Admit: 2013-08-04 | Discharge: 2013-08-04 | Disposition: A | Discharge status: Home / Self Care | Payer: PRIVATE HEALTH INSURANCE | Source: Home / Self Care

## 2013-08-04 DIAGNOSIS — M79609 Pain in unspecified limb: Secondary | ICD-10-CM | POA: Diagnosis not present

## 2013-08-04 DIAGNOSIS — J45901 Unspecified asthma with (acute) exacerbation: Secondary | ICD-10-CM

## 2013-08-04 DIAGNOSIS — M79601 Pain in right arm: Secondary | ICD-10-CM

## 2013-08-04 MED ORDER — MELOXICAM 15 MG PO TABS: 15.0000 mg | ORAL_TABLET | Freq: Every day | ORAL | Status: DC

## 2013-08-04 MED ORDER — KETOROLAC TROMETHAMINE 60 MG/2ML IM SOLN
60.0000 mg | Freq: Once | INTRAMUSCULAR | Status: AC
  Administered 2013-08-04: 60 mg via INTRAMUSCULAR

## 2013-08-04 MED ORDER — KETOROLAC TROMETHAMINE 60 MG/2ML IM SOLN
INTRAMUSCULAR | Status: AC
  Filled 2013-08-04: qty 2

## 2013-08-04 MED ORDER — BECLOMETHASONE DIPROPIONATE 80 MCG/ACT IN AERS: 2.0000 | INHALATION_SPRAY | Freq: Two times a day (BID) | RESPIRATORY_TRACT | Status: DC

## 2013-08-04 NOTE — ED Notes (Signed)
Pt  Reports   Pain  r  Arm  X  4  Days    -  Pt  Reports  She  Has  Had  Problems  In past  From that  Arm    denys  Recent  Injury  -  She  Also  Reports  Cough  And  Tightness  In  Chest   From her  Asthma         unreleived  By  Her  Inhaler

## 2013-08-04 NOTE — Discharge Instructions (Signed)
Since you are having such frequent asthma exacerbations, we started a daily inhaler called QVAR.  Take 2 puffs twice a day everyday. You can use the albuterol inhaler every 4 hours as needed.  We gave you a shot of toradol for your arm pain. Take meloxicam, starting tomorrow, daily as needed for pain.  Do not take with ibuprofen or motrin.  Follow up with your regular doctor in the next week. If you develop fevers or severe shortness of breath, please go to the ER.

## 2013-08-04 NOTE — ED Provider Notes (Signed)
CSN: [id number]     Arrival date & time 08/04/13  1133 History   None    Chief Complaint  Patient presents with  . Arm Problem   (Consider location/radiation/quality/duration/timing/severity/associated sxs/prior Treatment) HPI She is here today for right arm pain and asthma exacerbation.  1. right arm pain: This is a chronic recurring problem since 2003. She has a diagnosis of complex regional pain syndrome. She reports intermittent flares, typically with extreme cold or extreme hot weather. Pain is located all down the right arm from the shoulder down to the hand. She does report some tingling, weakness, swelling in the right hand. She states all of this is very typical of her flares. She states nothing has really worked very well in the past. She is planning on seeing her primary care doctor this week, however needed something for pain today.  2. asthma exacerbation: She reports frequent use of her albuterol inhaler over the last 2 months. Today, she describes symptoms of chest tightness, wheezing, and cough. She denies any fevers, chills, rhinorrhea. She used her albuterol this morning with minimal improvement. She is not on any controller medications.  Past Medical History  Diagnosis Date  . Asthma   . Hypertension   . Anxiety   . CVA (cerebral infarction)   . Unspecified asthma(493.90) 03/17/2011  . Polysubstance abuse 03/17/2011    positive cocaine 08/07/10 er visit; States she has never had problems with drugs.  . Hyperlipidemia 03/17/2011  . Anemia, unspecified 03/17/2011  . Aortic regurgitation 03/17/2011    Pt. denies problem with her heart.  . Mitral valve regurgitation 03/17/2011    Pt. said she never had problem with her heart  . Depression 03/17/2011  . Peripheral neuropathy 03/17/2011    Pt. denies this problem  . Diverticulitis   . Diverticulosis   . Complex regional pain syndrome   . Renal insufficiency syndrome   . Blood clot in vein    Past Surgical History  Procedure  Laterality Date  . Knee arthroscopy Right 1992  . Orthopedic surgery    . Cholecystectomy    . Thumb surgery Right 2003    R thumb reconstruction   Family History  Problem Relation Age of Onset  . Diabetes Mother   . Hypertension Mother   . Prostate cancer Father   . Hypertension Father   . Diabetes Father   . Diabetes Father   . Breast cancer Paternal Aunt     x 2   History  Substance Use Topics  . Smoking status: Current Every Day Smoker    Types: Cigars  . Smokeless tobacco: Not on file  . Alcohol Use: Yes     Comment: occasional beer   OB History   Grav Para Term Preterm Abortions TAB SAB Ect Mult Living                 Review of Systems  Constitutional: Negative.   HENT: Negative.   Eyes: Negative.   Respiratory: Positive for cough, chest tightness and wheezing. Negative for shortness of breath.   Cardiovascular: Negative.   Gastrointestinal: Negative.   Genitourinary: Negative.   Musculoskeletal:       Right arm pain  Skin: Negative.   Neurological: Negative.     Allergies  Cymbalta; Klonopin; Codeine; and Latex  Home Medications   Prior to Admission medications   Medication Sig Start Date End Date Taking? Authorizing Provider  albuterol (PROVENTIL HFA;VENTOLIN HFA) 108 (90 BASE) MCG/ACT inhaler Inhale 1-2 puffs into  the lungs every 6 (six) hours as needed for wheezing. 02/12/12   Harden Mo, MD  amLODipine (NORVASC) 10 MG tablet Take 10 mg by mouth daily.      Historical Provider, MD  aspirin 325 MG tablet Take 325 mg by mouth daily.      Historical Provider, MD  beclomethasone (QVAR) 80 MCG/ACT inhaler Inhale 2 puffs into the lungs 2 (two) times daily. 08/04/13   Melony Overly, MD  fexofenadine (ALLEGRA) 60 MG tablet Take 1 tablet (60 mg total) by mouth 2 (two) times daily. 04/17/12   Rosana Hoes, MD  fluticasone (FLONASE) 50 MCG/ACT nasal spray Place 2 sprays into the nose daily. 05/09/11 05/08/12  Billy Fischer, MD  hydrochlorothiazide (HYDRODIURIL)  12.5 MG tablet Take 12.5 mg by mouth daily.      Historical Provider, MD  meloxicam (MOBIC) 15 MG tablet Take 1 tablet (15 mg total) by mouth daily. 08/04/13   Melony Overly, MD  rosuvastatin (CRESTOR) 10 MG tablet Take 10 mg by mouth daily.      Historical Provider, MD   BP 123/80  Pulse 88  Temp(Src) 98.2 F (36.8 C) (Oral)  Resp 18  SpO2 99%  LMP 07/16/2013 Physical Exam  Constitutional: She is oriented to person, place, and time. She appears well-developed and well-nourished. No distress.  HENT:  Head: Normocephalic and atraumatic.  Cardiovascular: Normal rate, regular rhythm and normal heart sounds.   Pulmonary/Chest: Effort normal. No respiratory distress. She has wheezes (mild and scattered). She has no rales.  Musculoskeletal:  Right arm with mild swelling of hand; full range of motion in shoulder and wrist but with pain; diffuse pain to light touch in the right arm.  Neurological: She is alert and oriented to person, place, and time.  Skin: Skin is warm and dry. No rash noted. She is not diaphoretic.    ED Course  Procedures (including critical care time) Labs Review Labs Reviewed - No data to display  Imaging Review No results found.   MDM   1. Asthma exacerbation   2. Right arm pain    1. asthma exacerbation: No sign of viral URI or pneumonia. Likely trigger is allergies. Will start Qvar twice a day. Discussed okay to use albuterol every 4 hours as needed. She is to followup with her PCP in the next week or so. Reviewed reasons to go to the ER. 2. right arm pain: Known history of CRPS. Will give Toradol 60 mg IM now. Prescription for meloxicam given as well. Will not do narcotics as she states these have not worked well in the past and she is driving today.    Melony Overly, MD 08/04/13 1240

## 2013-11-17 ENCOUNTER — Other Ambulatory Visit (HOSPITAL_COMMUNITY): Payer: Self-pay | Admitting: Family Medicine

## 2013-11-17 DIAGNOSIS — Z1231 Encounter for screening mammogram for malignant neoplasm of breast: Secondary | ICD-10-CM

## 2013-11-21 ENCOUNTER — Ambulatory Visit (HOSPITAL_COMMUNITY): Admission: RE | Admit: 2013-11-21 | Payer: PRIVATE HEALTH INSURANCE | Source: Ambulatory Visit

## 2013-11-30 ENCOUNTER — Encounter (HOSPITAL_COMMUNITY): Payer: Self-pay | Admitting: Emergency Medicine

## 2013-11-30 ENCOUNTER — Emergency Department (INDEPENDENT_AMBULATORY_CARE_PROVIDER_SITE_OTHER)
Admission: EM | Admit: 2013-11-30 | Discharge: 2013-11-30 | Disposition: A | Discharge status: Home / Self Care | Payer: PRIVATE HEALTH INSURANCE | Source: Home / Self Care | Attending: Emergency Medicine | Admitting: Emergency Medicine

## 2013-11-30 DIAGNOSIS — J4541 Moderate persistent asthma with (acute) exacerbation: Secondary | ICD-10-CM

## 2013-11-30 DIAGNOSIS — J069 Acute upper respiratory infection, unspecified: Secondary | ICD-10-CM

## 2013-11-30 MED ORDER — IPRATROPIUM-ALBUTEROL 0.5-2.5 (3) MG/3ML IN SOLN
3.0000 mL | Freq: Once | RESPIRATORY_TRACT | Status: AC
  Administered 2013-11-30: 3 mL via RESPIRATORY_TRACT

## 2013-11-30 MED ORDER — ALBUTEROL SULFATE (2.5 MG/3ML) 0.083% IN NEBU
INHALATION_SOLUTION | RESPIRATORY_TRACT | Status: AC
  Filled 2013-11-30: qty 3

## 2013-11-30 MED ORDER — ALBUTEROL SULFATE HFA 108 (90 BASE) MCG/ACT IN AERS: 2.0000 | INHALATION_SPRAY | RESPIRATORY_TRACT | Status: DC | PRN

## 2013-11-30 MED ORDER — TRIAMCINOLONE ACETONIDE 40 MG/ML IJ SUSP
40.0000 mg | Freq: Once | INTRAMUSCULAR | Status: AC
  Administered 2013-11-30: 40 mg via INTRAMUSCULAR

## 2013-11-30 MED ORDER — TRIAMCINOLONE ACETONIDE 40 MG/ML IJ SUSP
INTRAMUSCULAR | Status: AC
  Filled 2013-11-30: qty 1

## 2013-11-30 MED ORDER — IPRATROPIUM-ALBUTEROL 0.5-2.5 (3) MG/3ML IN SOLN
RESPIRATORY_TRACT | Status: AC
  Filled 2013-11-30: qty 3

## 2013-11-30 MED ORDER — ALBUTEROL SULFATE (2.5 MG/3ML) 0.083% IN NEBU
2.5000 mg | INHALATION_SOLUTION | Freq: Once | RESPIRATORY_TRACT | Status: AC
  Administered 2013-11-30: 2.5 mg via RESPIRATORY_TRACT

## 2013-11-30 MED ORDER — PREDNISONE 20 MG PO TABS: ORAL_TABLET | ORAL | Status: DC

## 2013-11-30 MED ORDER — ALBUTEROL SULFATE (2.5 MG/3ML) 0.083% IN NEBU
2.5000 mg | INHALATION_SOLUTION | Freq: Once | RESPIRATORY_TRACT | Status: AC
Start: 2013-11-30 — End: 2013-11-30
  Administered 2013-11-30: 2.5 mg via RESPIRATORY_TRACT

## 2013-11-30 NOTE — ED Notes (Signed)
Patient reports cough, cold symptoms for a week with worsening asthma symptoms.  Patient reports rescue inhaler not helping.  Scattered wheezing, congested cough, is speaking in complete sentences.  Reports minimal phlegm production.

## 2013-11-30 NOTE — Discharge Instructions (Signed)
Asthma, Acute Bronchospasm °Acute bronchospasm caused by asthma is also referred to as an asthma attack. Bronchospasm means your air passages become narrowed. The narrowing is caused by inflammation and tightening of the muscles in the air tubes (bronchi) in your lungs. This can make it hard to breathe or cause you to wheeze and cough. °CAUSES °Possible triggers are: °· Animal dander from the skin, hair, or feathers of animals. °· Dust mites contained in house dust. °· Cockroaches. °· Pollen from trees or grass. °· Mold. °· Cigarette or tobacco smoke. °· Air pollutants such as dust, household cleaners, hair sprays, aerosol sprays, paint fumes, strong chemicals, or strong odors. °· Cold air or weather changes. Cold air may trigger inflammation. Winds increase molds and pollens in the air. °· Strong emotions such as crying or laughing hard. °· Stress. °· Certain medicines such as aspirin or beta-blockers. °· Sulfites in foods and drinks, such as dried fruits and wine. °· Infections or inflammatory conditions, such as a flu, cold, or inflammation of the nasal membranes (rhinitis). °· Gastroesophageal reflux disease (GERD). GERD is a condition where stomach acid backs up into your esophagus. °· Exercise or strenuous activity. °SIGNS AND SYMPTOMS  °· Wheezing. °· Excessive coughing, particularly at night. °· Chest tightness. °· Shortness of breath. °DIAGNOSIS  °Your health care provider will ask you about your medical history and perform a physical exam. A chest X-ray or blood testing may be performed to look for other causes of your symptoms or other conditions that may have triggered your asthma attack.  °TREATMENT  °Treatment is aimed at reducing inflammation and opening up the airways in your lungs.  Most asthma attacks are treated with inhaled medicines. These include quick relief or rescue medicines (such as bronchodilators) and controller medicines (such as inhaled corticosteroids). These medicines are sometimes  given through an inhaler or a nebulizer. Systemic steroid medicine taken by mouth or given through an IV tube also can be used to reduce the inflammation when an attack is moderate or severe. Antibiotic medicines are only used if a bacterial infection is present.  °HOME CARE INSTRUCTIONS  °· Rest. °· Drink plenty of liquids. This helps the mucus to remain thin and be easily coughed up. Only use caffeine in moderation and do not use alcohol until you have recovered from your illness. °· Do not smoke. Avoid being exposed to secondhand smoke. °· You play a critical role in keeping yourself in good health. Avoid exposure to things that cause you to wheeze or to have breathing problems. °· Keep your medicines up-to-date and available. Carefully follow your health care provider's treatment plan. °· Take your medicine exactly as prescribed. °· When pollen or pollution is bad, keep windows closed and use an air conditioner or go to places with air conditioning. °· Asthma requires careful medical care. See your health care provider for a follow-up as advised. If you are more than [redacted] weeks pregnant and you were prescribed any new medicines, let your obstetrician know about the visit and how you are doing. Follow up with your health care provider as directed. °· After you have recovered from your asthma attack, make an appointment with your outpatient doctor to talk about ways to reduce the likelihood of future attacks. If you do not have a doctor who manages your asthma, make an appointment with a primary care doctor to discuss your asthma. °SEEK IMMEDIATE MEDICAL CARE IF:  °· You are getting worse. °· You have trouble breathing. If severe, call your local   emergency services (911 in the [place]  You develop chest pain or discomfort.  You are vomiting.  You are not able to keep fluids down.  You are coughing up yellow, green, brown, or bloody sputum.  You have a fever and your symptoms suddenly get worse.  You have  trouble swallowing. MAKE SURE YOU:   Understand these instructions.  Will watch your condition.  Will get help right away if you are not doing well or get worse. Document Released: 04/19/2006 Document Revised: 01/07/2013 Document Reviewed: 07/10/2012 [facility] Patient Information 2015 [place], [place]. This information is not intended to replace advice given to you by your health care provider. Make sure you discuss any questions you have with your health care provider.  Cough, Adult  A cough is a reflex that helps clear your throat and airways. It can help heal the body or may be a reaction to an irritated airway. A cough may only last 2 or 3 weeks (acute) or may last more than 8 weeks (chronic).  CAUSES Acute cough:  Viral or bacterial infections. Chronic cough:  Infections.  Allergies.  Asthma.  Post-nasal drip.  Smoking.  Heartburn or acid reflux.  Some medicines.  Chronic lung problems (COPD).  Cancer. SYMPTOMS   Cough.  Fever.  Chest pain.  Increased breathing rate.  High-pitched whistling sound when breathing (wheezing).  Colored mucus that you cough up (sputum). TREATMENT   A bacterial cough may be treated with antibiotic medicine.  A viral cough must run its course and will not respond to antibiotics.  Your caregiver may recommend other treatments if you have a chronic cough. HOME CARE INSTRUCTIONS   Only take over-the-counter or prescription medicines for pain, discomfort, or fever as directed by your caregiver. Use cough suppressants only as directed by your caregiver.  Use a cold steam vaporizer or humidifier in your bedroom or home to help loosen secretions.  Sleep in a semi-upright position if your cough is worse at night.  Rest as needed.  Stop smoking if you smoke. SEEK IMMEDIATE MEDICAL CARE IF:   You have pus in your sputum.  Your cough starts to worsen.  You cannot control your cough with suppressants and are losing  sleep.  You begin coughing up blood.  You have difficulty breathing.  You develop pain which is getting worse or is uncontrolled with medicine.  You have a fever. MAKE SURE YOU:   Understand these instructions.  Will watch your condition.  Will get help right away if you are not doing well or get worse. Document Released: 07/01/2010 Document Revised: 03/27/2011 Document Reviewed: 07/01/2010 [facility] Patient Information 2015 [place], [place]. This information is not intended to replace advice given to you by your health care provider. Make sure you discuss any questions you have with your health care provider.  How to Use an Inhaler Using your inhaler correctly is very important. Good technique will make sure that the medicine reaches your lungs.  HOW TO USE AN INHALER:  Take the cap off the inhaler.  If this is the first time using your inhaler, you need to prime it. Shake the inhaler for 5 seconds. Release four puffs into the air, away from your face. Ask your doctor for help if you have questions.  Shake the inhaler for 5 seconds.  Turn the inhaler so the bottle is above the mouthpiece.  Put your pointer finger on top of the bottle. Your thumb holds the bottom of the inhaler.  Open your mouth.  Either hold the inhaler away from your mouth (the width of 2 fingers) or place your lips tightly around the mouthpiece. Ask your doctor which way to use your inhaler.  Breathe out as much air as possible.  Breathe in and push down on the bottle 1 time to release the medicine. You will feel the medicine go in your mouth and throat.  Continue to take a deep breath in very slowly. Try to fill your lungs.  After you have breathed in completely, hold your breath for 10 seconds. This will help the medicine to settle in your lungs. If you cannot hold your breath for 10 seconds, hold it for as long as you can before you breathe out.  Breathe out slowly, through pursed lips. Whistling is  an example of pursed lips.  If your doctor has told you to take more than 1 puff, wait at least 15-30 seconds between puffs. This will help you get the best results from your medicine. Do not use the inhaler more than your doctor tells you to.  Put the cap back on the inhaler.  Follow the directions from your doctor or from the inhaler package about cleaning the inhaler. If you use more than one inhaler, ask your doctor which inhalers to use and what order to use them in. Ask your doctor to help you figure out when you will need to refill your inhaler.  If you use a steroid inhaler, always rinse your mouth with water after your last puff, gargle and spit out the water. Do not swallow the water. GET HELP IF:  The inhaler medicine only partially helps to stop wheezing or shortness of breath.  You are having trouble using your inhaler.  You have some increase in thick spit (phlegm). GET HELP RIGHT AWAY IF:  The inhaler medicine does not help your wheezing or shortness of breath or you have tightness in your chest.  You have dizziness, headaches, or fast heart rate.  You have chills, fever, or night sweats.  You have a large increase of thick spit, or your thick spit is bloody. MAKE SURE YOU:   Understand these instructions.  Will watch your condition.  Will get help right away if you are not doing well or get worse. Document Released: 10/12/2007 Document Revised: 10/23/2012 Document Reviewed: 08/01/2012 [facility] Patient Information 2015 [place], [place]. This information is not intended to replace advice given to you by your health care provider. Make sure you discuss any questions you have with your health care provider.  Upper Respiratory Infection, Adult Allegra 180 mg a day for drainage Robitussin DM for cough and drainage An upper respiratory infection (URI) is also sometimes known as the common cold. The upper respiratory tract includes the nose, sinuses, throat, trachea, and  bronchi. Bronchi are the airways leading to the lungs. Most people improve within 1 week, but symptoms can last up to 2 weeks. A residual cough may last even longer.  CAUSES Many different viruses can infect the tissues lining the upper respiratory tract. The tissues become irritated and inflamed and often become very moist. Mucus production is also common. A cold is contagious. You can easily spread the virus to others by oral contact. This includes kissing, sharing a glass, coughing, or sneezing. Touching your mouth or nose and then touching a surface, which is then touched by another person, can also spread the virus. SYMPTOMS  Symptoms typically develop 1 to 3 days after you come in contact with a cold virus.  Symptoms vary from person to person. They may include:  Runny nose.  Sneezing.  Nasal congestion.  Sinus irritation.  Sore throat.  Loss of voice (laryngitis).  Cough.  Fatigue.  Muscle aches.  Loss of appetite.  Headache.  Low-grade fever. DIAGNOSIS  You might diagnose your own cold based on familiar symptoms, since most people get a cold 2 to 3 times a year. Your caregiver can confirm this based on your exam. Most importantly, your caregiver can check that your symptoms are not due to another disease such as strep throat, sinusitis, pneumonia, asthma, or epiglottitis. Blood tests, throat tests, and X-rays are not necessary to diagnose a common cold, but they may sometimes be helpful in excluding other more serious diseases. Your caregiver will decide if any further tests are required. RISKS AND COMPLICATIONS  You may be at risk for a more severe case of the common cold if you smoke cigarettes, have chronic heart disease (such as heart failure) or lung disease (such as asthma), or if you have a weakened immune system. The very young and very old are also at risk for more serious infections. Bacterial sinusitis, middle ear infections, and bacterial pneumonia can complicate  the common cold. The common cold can worsen asthma and chronic obstructive pulmonary disease (COPD). Sometimes, these complications can require emergency medical care and may be life-threatening. PREVENTION  The best way to protect against getting a cold is to practice good hygiene. Avoid oral or hand contact with people with cold symptoms. Wash your hands often if contact occurs. There is no clear evidence that vitamin C, vitamin E, echinacea, or exercise reduces the chance of developing a cold. However, it is always recommended to get plenty of rest and practice good nutrition. TREATMENT  Treatment is directed at relieving symptoms. There is no cure. Antibiotics are not effective, because the infection is caused by a virus, not by bacteria. Treatment may include:  Increased fluid intake. Sports drinks offer valuable electrolytes, sugars, and fluids.  Breathing heated mist or steam (vaporizer or shower).  Eating chicken soup or other clear broths, and maintaining good nutrition.  Getting plenty of rest.  Using gargles or lozenges for comfort.  Controlling fevers with ibuprofen or acetaminophen as directed by your caregiver.  Increasing usage of your inhaler if you have asthma. Zinc gel and zinc lozenges, taken in the first 24 hours of the common cold, can shorten the duration and lessen the severity of symptoms. Pain medicines may help with fever, muscle aches, and throat pain. A variety of non-prescription medicines are available to treat congestion and runny nose. Your caregiver can make recommendations and may suggest nasal or lung inhalers for other symptoms.  HOME CARE INSTRUCTIONS   Only take over-the-counter or prescription medicines for pain, discomfort, or fever as directed by your caregiver.  Use a warm mist humidifier or inhale steam from a shower to increase air moisture. This may keep secretions moist and make it easier to breathe.  Drink enough water and fluids to keep your  urine clear or pale yellow.  Rest as needed.  Return to work when your temperature has returned to normal or as your caregiver advises. You may need to stay home longer to avoid infecting others. You can also use a face mask and careful hand washing to prevent spread of the virus. SEEK MEDICAL CARE IF:   After the first few days, you feel you are getting worse rather than better.  You need your caregiver's advice about  medicines to control symptoms.  You develop chills, worsening shortness of breath, or brown or red sputum. These may be signs of pneumonia.  You develop yellow or brown nasal discharge or pain in the face, especially when you bend forward. These may be signs of sinusitis.  You develop a fever, swollen neck glands, pain with swallowing, or white areas in the back of your throat. These may be signs of strep throat. SEEK IMMEDIATE MEDICAL CARE IF:   You have a fever.  You develop severe or persistent headache, ear pain, sinus pain, or chest pain.  You develop wheezing, a prolonged cough, cough up blood, or have a change in your usual mucus (if you have chronic lung disease).  You develop sore muscles or a stiff neck. Document Released: 06/28/2000 Document Revised: 03/27/2011 Document Reviewed: 04/09/2013 [facility] Patient Information 2015 [place], [place]. This information is not intended to replace advice given to you by your health care provider. Make sure you discuss any questions you have with your health care provider.

## 2013-11-30 NOTE — ED Provider Notes (Signed)
CSN: [id number]     Arrival date & time 11/30/13  1033 History   First MD Initiated Contact with Patient 11/30/13 1112     Chief Complaint  Patient presents with  . Asthma   (Consider location/radiation/quality/duration/timing/severity/associated sxs/prior Treatment) HPI Comments: URI sx's this week, runny and stuffy nose, cough, PND. Last 2-3 days chest congestion and wheezing. Smoker of cigars   Past Medical History  Diagnosis Date  . Asthma   . Hypertension   . Anxiety   . CVA (cerebral infarction)   . Unspecified asthma(493.90) 03/17/2011  . Polysubstance abuse 03/17/2011    positive cocaine 08/07/10 er visit; States she has never had problems with drugs.  . Hyperlipidemia 03/17/2011  . Anemia, unspecified 03/17/2011  . Aortic regurgitation 03/17/2011    Pt. denies problem with her heart.  . Mitral valve regurgitation 03/17/2011    Pt. said she never had problem with her heart  . Depression 03/17/2011  . Peripheral neuropathy 03/17/2011    Pt. denies this problem  . Diverticulitis   . Diverticulosis   . Complex regional pain syndrome   . Renal insufficiency syndrome   . Blood clot in vein    Past Surgical History  Procedure Laterality Date  . Knee arthroscopy Right 1992  . Orthopedic surgery    . Cholecystectomy    . Thumb surgery Right 2003    R thumb reconstruction   Family History  Problem Relation Age of Onset  . Diabetes Mother   . Hypertension Mother   . Prostate cancer Father   . Hypertension Father   . Diabetes Father   . Diabetes Father   . Breast cancer Paternal Aunt     x 2   History  Substance Use Topics  . Smoking status: Current Every Day Smoker    Types: Cigars  . Smokeless tobacco: Not on file  . Alcohol Use: Yes     Comment: occasional beer   OB History    No data available     Review of Systems  Constitutional: Positive for activity change. Negative for fever.  HENT: Positive for congestion, ear pain, postnasal drip and rhinorrhea.    Respiratory: Positive for cough, shortness of breath and wheezing.   Cardiovascular: Negative for chest pain.  Gastrointestinal: Negative.   Neurological: Negative.     Allergies  Cymbalta; Klonopin; Codeine; and Latex  Home Medications   Prior to Admission medications   Medication Sig Start Date End Date Taking? Authorizing Provider  albuterol (PROVENTIL HFA;VENTOLIN HFA) 108 (90 BASE) MCG/ACT inhaler Inhale 1-2 puffs into the lungs every 6 (six) hours as needed for wheezing. 02/12/12   Harden Mo, MD  amLODipine (NORVASC) 10 MG tablet Take 10 mg by mouth daily.      Historical Provider, MD  aspirin 325 MG tablet Take 325 mg by mouth daily.      Historical Provider, MD  beclomethasone (QVAR) 80 MCG/ACT inhaler Inhale 2 puffs into the lungs 2 (two) times daily. 08/04/13   Melony Overly, MD  fexofenadine (ALLEGRA) 60 MG tablet Take 1 tablet (60 mg total) by mouth 2 (two) times daily. 04/17/12   Rosana Hoes, MD  fluticasone (FLONASE) 50 MCG/ACT nasal spray Place 2 sprays into the nose daily. 05/09/11 05/08/12  Billy Fischer, MD  hydrochlorothiazide (HYDRODIURIL) 12.5 MG tablet Take 12.5 mg by mouth daily.      Historical Provider, MD  meloxicam (MOBIC) 15 MG tablet Take 1 tablet (15 mg total) by mouth daily. 08/04/13  Melony Overly, MD  rosuvastatin (CRESTOR) 10 MG tablet Take 10 mg by mouth daily.      Historical Provider, MD   BP 140/81 mmHg  Pulse 115  Temp(Src) 98.6 F (37 C) (Oral)  Resp 22  SpO2 96% Physical Exam  Constitutional: She is oriented to person, place, and time. She appears well-developed and well-nourished. No distress.  HENT:  Mouth/Throat: No oropharyngeal exudate.  Bilat TM's nl OP redness, injection, clear PND  Eyes: Conjunctivae and EOM are normal.  Neck: Normal range of motion. Neck supple.  Cardiovascular: Normal rate, regular rhythm and normal heart sounds.   Pulmonary/Chest: Effort normal. She has wheezes. She has no rales.  Musculoskeletal: She exhibits  no edema or tenderness.  Lymphadenopathy:    She has no cervical adenopathy.  Neurological: She is alert and oriented to person, place, and time.  Skin: Skin is warm. She is not diaphoretic.  Psychiatric: She has a normal mood and affect.  Nursing note and vitals reviewed.   ED Course  Procedures (including critical care time) Labs Review Labs Reviewed - No data to display  Imaging Review No results found.   MDM   1. URI (upper respiratory infection)   2. Asthma exacerbation attacks, moderate persistent     Post Duoneb neb #1: per pt good improvement but not to baseline. Scattered wheezes remain. Fair air movement. Adm albuterol 5mg  20 min post first one Kenalog 40 mg IM Post second neb pt st feels much better and at her baseline. Few wheezes and much improved air movement. Prednisone taper start tomorrow Allegra for drainage Albuterol HFA Cont Qvar F/U with your doctor    Janne Napoleon, NP 11/30/13 1206

## 2013-12-15 ENCOUNTER — Emergency Department (INDEPENDENT_AMBULATORY_CARE_PROVIDER_SITE_OTHER)
Admission: EM | Admit: 2013-12-15 | Discharge: 2013-12-15 | Disposition: A | Discharge status: Home / Self Care | Payer: PRIVATE HEALTH INSURANCE | Source: Home / Self Care | Attending: Emergency Medicine | Admitting: Emergency Medicine

## 2013-12-15 ENCOUNTER — Encounter (HOSPITAL_COMMUNITY): Payer: Self-pay | Admitting: *Deleted

## 2013-12-15 DIAGNOSIS — R0982 Postnasal drip: Secondary | ICD-10-CM

## 2013-12-15 DIAGNOSIS — J069 Acute upper respiratory infection, unspecified: Secondary | ICD-10-CM

## 2013-12-15 DIAGNOSIS — J9801 Acute bronchospasm: Secondary | ICD-10-CM

## 2013-12-15 NOTE — ED Notes (Signed)
Pt  Reports  Seen  sev  X   Over the  Last  sev  Weeks    Seen  By  Provider        Last  Wand  Was  rx  An  Anti  Biotic    That  She  Filled  And  Completed   -  She  Continues  To have  Symptoms         And  Has  A  Cough  As well  As thickmucous production  She  Had  Abn appt  Today at 1pm   With  Her  Family  Dr but  Kristeen Miss    She  Could  Not  Wait

## 2013-12-15 NOTE — Discharge Instructions (Signed)
Bronchospasm Continue the albuterol neb as needed. A bronchospasm is when the tubes that carry air in and out of your lungs (airways) spasm or tighten. During a bronchospasm it is hard to breathe. This is because the airways get smaller. A bronchospasm can be triggered by:  Allergies. These may be to animals, pollen, food, or mold.  Infection. This is a common cause of bronchospasm.  Exercise.  Irritants. These include pollution, cigarette smoke, strong odors, aerosol sprays, and paint fumes.  Weather changes.  Stress.  Being emotional. HOME CARE   Always have a plan for getting help. Know when to call your doctor and local emergency services (911 in the [place] Know where you can get emergency care.  Only take medicines as told by your doctor.  If you were prescribed an inhaler or nebulizer machine, ask your doctor how to use it correctly. Always use a spacer with your inhaler if you were given one.  Stay calm during an attack. Try to relax and breathe more slowly.  Control your home environment:  Change your heating and air conditioning filter at least once a month.  Limit your use of fireplaces and wood stoves.  Do not  smoke. Do not  allow smoking in your home.  Avoid perfumes and fragrances.  Get rid of pests (such as roaches and mice) and their droppings.  Throw away plants if you see mold on them.  Keep your house clean and dust free.  Replace carpet with wood, tile, or vinyl flooring. Carpet can trap dander and dust.  Use allergy-proof pillows, mattress covers, and box spring covers.  Wash bed sheets and blankets every week in hot water. Dry them in a dryer.  Use blankets that are made of polyester or cotton.  Wash hands frequently. GET HELP IF:  You have muscle aches.  You have chest pain.  The thick spit you spit or cough up (sputum) changes from clear or white to yellow, green, gray, or bloody.  The thick spit you spit or cough up gets  thicker.  There are problems that may be related to the medicine you are given such as:  A rash.  Itching.  Swelling.  Trouble breathing. GET HELP RIGHT AWAY IF:  You feel you cannot breathe or catch your breath.  You cannot stop coughing.  Your treatment is not helping you breathe better.  You have very bad chest pain. MAKE SURE YOU:   Understand these instructions.  Will watch your condition.  Will get help right away if you are not doing well or get worse. Document Released: 10/30/2008 Document Revised: 01/07/2013 Document Reviewed: 06/25/2012 [facility] Patient Information 2015 [place], [place]. This information is not intended to replace advice given to you by your health care provider. Make sure you discuss any questions you have with your health care provider.  Upper Respiratory Infection, Adult Allegra for drainage Lots of water Robitussin DM Humidifier may help An upper respiratory infection (URI) is also known as the common cold. It is often caused by a type of germ (virus). Colds are easily spread (contagious). You can pass it to others by kissing, coughing, sneezing, or drinking out of the same glass. Usually, you get better in 1 or 2 weeks.  HOME CARE   Only take medicine as told by your doctor.  Use a warm mist humidifier or breathe in steam from a hot shower.  Drink enough water and fluids to keep your pee (urine) clear or pale yellow.  Get plenty  of rest.  Return to work when your temperature is back to normal or as told by your doctor. You may use a face mask and wash your hands to stop your cold from spreading. GET HELP RIGHT AWAY IF:   After the first few days, you feel you are getting worse.  You have questions about your medicine.  You have chills, shortness of breath, or brown or red spit (mucus).  You have yellow or brown snot (nasal discharge) or pain in the face, especially when you bend forward.  You have a fever, puffy (swollen) neck,  pain when you swallow, or white spots in the back of your throat.  You have a bad headache, ear pain, sinus pain, or chest pain.  You have a high-pitched whistling sound when you breathe in and out (wheezing).  You have a lasting cough or cough up blood.  You have sore muscles or a stiff neck. MAKE SURE YOU:   Understand these instructions.  Will watch your condition.  Will get help right away if you are not doing well or get worse. Document Released: 06/21/2007 Document Revised: 03/27/2011 Document Reviewed: 04/09/2013 [facility] Patient Information 2015 [place], [place]. This information is not intended to replace advice given to you by your health care provider. Make sure you discuss any questions you have with your health care provider.

## 2013-12-15 NOTE — ED Provider Notes (Signed)
CSN: [id number]     Arrival date & time 12/15/13  1031 History   First MD Initiated Contact with Patient 12/15/13 1046     Chief Complaint  Patient presents with  . URI   (Consider location/radiation/quality/duration/timing/severity/associated sxs/prior Treatment) HPI Comments: 53 year old female who has been smoking cigars for several years but quit 2 weeks ago is complaining of persistent postpharyngeal drainage, thick mucus associated with the drainage and cold. She states that the mucus gets cold in her upper chest. She was seen in the urgent care at November 1 and treated with DuoNeb's, steroids and OTC meds for symptoms. She followed up with her PCP who performed a chest x-ray and administered antibiotics. Per patient the chest x-ray was normal. He is not complaining of shortness of breath. Her primary complaint is the thick drainage and cough. She is using her albuterol nebulizer periodically.  Patient is a 53 y.o. female presenting with URI.  URI Presenting symptoms: congestion, cough and rhinorrhea   Presenting symptoms: no ear pain, no fever and no sore throat   Associated symptoms: wheezing     Past Medical History  Diagnosis Date  . Asthma   . Hypertension   . Anxiety   . CVA (cerebral infarction)   . Unspecified asthma(493.90) 03/17/2011  . Polysubstance abuse 03/17/2011    positive cocaine 08/07/10 er visit; States she has never had problems with drugs.  . Hyperlipidemia 03/17/2011  . Anemia, unspecified 03/17/2011  . Aortic regurgitation 03/17/2011    Pt. denies problem with her heart.  . Mitral valve regurgitation 03/17/2011    Pt. said she never had problem with her heart  . Depression 03/17/2011  . Peripheral neuropathy 03/17/2011    Pt. denies this problem  . Diverticulitis   . Diverticulosis   . Complex regional pain syndrome   . Renal insufficiency syndrome   . Blood clot in vein    Past Surgical History  Procedure Laterality Date  . Knee arthroscopy Right 1992  .  Orthopedic surgery    . Cholecystectomy    . Thumb surgery Right 2003    R thumb reconstruction   Family History  Problem Relation Age of Onset  . Diabetes Mother   . Hypertension Mother   . Prostate cancer Father   . Hypertension Father   . Diabetes Father   . Diabetes Father   . Breast cancer Paternal Aunt     x 2   History  Substance Use Topics  . Smoking status: Current Every Day Smoker    Types: Cigars  . Smokeless tobacco: Not on file  . Alcohol Use: Yes     Comment: occasional beer   OB History    No data available     Review of Systems  Constitutional: Positive for activity change. Negative for fever and chills.  HENT: Positive for congestion, postnasal drip and rhinorrhea. Negative for ear pain and sore throat.   Respiratory: Positive for cough, chest tightness and wheezing.   Cardiovascular: Negative.  Negative for leg swelling.  Gastrointestinal: Negative.   Skin: Negative for rash.    Allergies  Cymbalta; Klonopin; Codeine; and Latex  Home Medications   Prior to Admission medications   Medication Sig Start Date End Date Taking? Authorizing Provider  albuterol (PROVENTIL HFA;VENTOLIN HFA) 108 (90 BASE) MCG/ACT inhaler Inhale 2 puffs into the lungs every 4 (four) hours as needed for wheezing or shortness of breath. 11/30/13   Janne Napoleon, NP  amLODipine (NORVASC) 10 MG tablet Take 10  mg by mouth daily.      Historical Provider, MD  aspirin 325 MG tablet Take 325 mg by mouth daily.      Historical Provider, MD  beclomethasone (QVAR) 80 MCG/ACT inhaler Inhale 2 puffs into the lungs 2 (two) times daily. 08/04/13   Melony Overly, MD  hydrochlorothiazide (HYDRODIURIL) 12.5 MG tablet Take 12.5 mg by mouth daily.      Historical Provider, MD  meloxicam (MOBIC) 15 MG tablet Take 1 tablet (15 mg total) by mouth daily. 08/04/13   Melony Overly, MD  predniSONE (DELTASONE) 20 MG tablet Take 3 tabs po on first day, 2 tabs second day, 2 tabs third day, 1 tab fourth day, 1 tab  5th day. Take with food. 11/30/13   Janne Napoleon, NP  rosuvastatin (CRESTOR) 10 MG tablet Take 10 mg by mouth daily.      Historical Provider, MD   BP 147/96 mmHg  Pulse 91  Temp(Src) 98.9 F (37.2 C) (Oral)  Resp 16  SpO2 97% Physical Exam  Constitutional: She is oriented to person, place, and time. She appears well-developed and well-nourished. No distress.  HENT:  Mouth/Throat: Oropharynx is clear and moist. No oropharyngeal exudate.  Bilateral TMs are normal Oropharynx with clear PND. Otherwise normal.  Eyes: Conjunctivae and EOM are normal.  Neck: Normal range of motion. Neck supple.  Cardiovascular: Normal rate, regular rhythm and normal heart sounds.   Pulmonary/Chest: Effort normal.  Rare upper airway expiratory wheeze. Occasional left lower lobe rhonchi. Good air movement.  Lymphadenopathy:    She has no cervical adenopathy.  Neurological: She is alert and oriented to person, place, and time.  Skin: Skin is warm and dry.  Psychiatric: She has a normal mood and affect.  Nursing note and vitals reviewed.   ED Course  Procedures (including critical care time) Labs Review Labs Reviewed - No data to display  Imaging Review No results found.   MDM   1. URI (upper respiratory infection)   2. PND (post-nasal drip)   3. Bronchospasm    COntinue the albuterol neb prn Allegra for drainage Lots of water Robitussin DM Humidifier may help See your PCP for follow up, today at [place], NP 12/15/13 1136

## 2013-12-27 ENCOUNTER — Encounter (HOSPITAL_COMMUNITY): Payer: Self-pay | Admitting: Emergency Medicine

## 2013-12-27 ENCOUNTER — Emergency Department (HOSPITAL_COMMUNITY)
Admission: EM | Admit: 2013-12-27 | Discharge: 2013-12-27 | Disposition: A | Discharge status: Home / Self Care | Payer: PRIVATE HEALTH INSURANCE | Attending: Emergency Medicine | Admitting: Emergency Medicine

## 2013-12-27 DIAGNOSIS — Z720 Tobacco use: Secondary | ICD-10-CM | POA: Diagnosis not present

## 2013-12-27 DIAGNOSIS — Z8719 Personal history of other diseases of the digestive system: Secondary | ICD-10-CM | POA: Insufficient documentation

## 2013-12-27 DIAGNOSIS — Z862 Personal history of diseases of the blood and blood-forming organs and certain disorders involving the immune mechanism: Secondary | ICD-10-CM | POA: Diagnosis not present

## 2013-12-27 DIAGNOSIS — I10 Essential (primary) hypertension: Secondary | ICD-10-CM | POA: Insufficient documentation

## 2013-12-27 DIAGNOSIS — Z8673 Personal history of transient ischemic attack (TIA), and cerebral infarction without residual deficits: Secondary | ICD-10-CM | POA: Diagnosis not present

## 2013-12-27 DIAGNOSIS — Z79899 Other long term (current) drug therapy: Secondary | ICD-10-CM | POA: Diagnosis not present

## 2013-12-27 DIAGNOSIS — Z8639 Personal history of other endocrine, nutritional and metabolic disease: Secondary | ICD-10-CM | POA: Diagnosis not present

## 2013-12-27 DIAGNOSIS — Z91040 Latex allergy status: Secondary | ICD-10-CM | POA: Insufficient documentation

## 2013-12-27 DIAGNOSIS — J45901 Unspecified asthma with (acute) exacerbation: Secondary | ICD-10-CM | POA: Diagnosis not present

## 2013-12-27 DIAGNOSIS — Z87448 Personal history of other diseases of urinary system: Secondary | ICD-10-CM | POA: Diagnosis not present

## 2013-12-27 DIAGNOSIS — Z8659 Personal history of other mental and behavioral disorders: Secondary | ICD-10-CM | POA: Insufficient documentation

## 2013-12-27 DIAGNOSIS — Z7952 Long term (current) use of systemic steroids: Secondary | ICD-10-CM | POA: Diagnosis not present

## 2013-12-27 DIAGNOSIS — Z791 Long term (current) use of non-steroidal anti-inflammatories (NSAID): Secondary | ICD-10-CM | POA: Insufficient documentation

## 2013-12-27 DIAGNOSIS — Z7951 Long term (current) use of inhaled steroids: Secondary | ICD-10-CM | POA: Insufficient documentation

## 2013-12-27 DIAGNOSIS — R062 Wheezing: Secondary | ICD-10-CM | POA: Diagnosis present

## 2013-12-27 MED ORDER — PREDNISONE 20 MG PO TABS
60.0000 mg | ORAL_TABLET | Freq: Once | ORAL | Status: AC
  Administered 2013-12-27: 60 mg via ORAL
  Filled 2013-12-27: qty 3

## 2013-12-27 MED ORDER — PREDNISONE 20 MG PO TABS: 60.0000 mg | ORAL_TABLET | Freq: Every day | ORAL | Status: DC

## 2013-12-27 MED ORDER — ALBUTEROL SULFATE HFA 108 (90 BASE) MCG/ACT IN AERS
1.0000 | INHALATION_SPRAY | Freq: Once | RESPIRATORY_TRACT | Status: AC
  Administered 2013-12-27: 2 via RESPIRATORY_TRACT
  Filled 2013-12-27: qty 6.7

## 2013-12-27 MED ORDER — IPRATROPIUM BROMIDE 0.02 % IN SOLN
0.5000 mg | Freq: Once | RESPIRATORY_TRACT | Status: AC
  Administered 2013-12-27: 0.5 mg via RESPIRATORY_TRACT
  Filled 2013-12-27: qty 2.5

## 2013-12-27 MED ORDER — ALBUTEROL SULFATE (2.5 MG/3ML) 0.083% IN NEBU
5.0000 mg | INHALATION_SOLUTION | Freq: Once | RESPIRATORY_TRACT | Status: AC
  Administered 2013-12-27: 5 mg via RESPIRATORY_TRACT
  Filled 2013-12-27: qty 6

## 2013-12-27 NOTE — ED Notes (Signed)
Pt here with c/o increasing wheezing over past couple weeks, has been seen at urgent care and primary care doctor but still is wheezing. No resp distress, wheezing throughout

## 2013-12-27 NOTE — Discharge Instructions (Signed)

## 2013-12-27 NOTE — ED Provider Notes (Signed)
CSN: [id number]     Arrival date & time 12/27/13  6967 History   First MD Initiated Contact with Patient 12/27/13 0749     Chief Complaint  Patient presents with  . Wheezing     (Consider location/radiation/quality/duration/timing/severity/associated sxs/prior Treatment) HPI Comments: Patient with a history of Asthma presents today with wheezing, cough, and congestion.  Symptoms have been present week and gradually worsening.   She has used her Albuterol inhaler without relief.  However, she thinks that her inhaler may have run out.  She reports that she saw her PCP last week for these symptoms and had a chest xray done, which was negative for Pneumonia.  She denies fever, chills, SOB, chest pain, ear pain, or sore throat.  She denies any prior hospitalizations or intubations for her Asthma.    The history is provided by the patient.    Past Medical History  Diagnosis Date  . Asthma   . Hypertension   . Anxiety   . CVA (cerebral infarction)   . Unspecified asthma(493.90) 03/17/2011  . Polysubstance abuse 03/17/2011    positive cocaine 08/07/10 er visit; States she has never had problems with drugs.  . Hyperlipidemia 03/17/2011  . Anemia, unspecified 03/17/2011  . Aortic regurgitation 03/17/2011    Pt. denies problem with her heart.  . Mitral valve regurgitation 03/17/2011    Pt. said she never had problem with her heart  . Depression 03/17/2011  . Peripheral neuropathy 03/17/2011    Pt. denies this problem  . Diverticulitis   . Diverticulosis   . Complex regional pain syndrome   . Renal insufficiency syndrome   . Blood clot in vein    Past Surgical History  Procedure Laterality Date  . Knee arthroscopy Right 1992  . Orthopedic surgery    . Cholecystectomy    . Thumb surgery Right 2003    R thumb reconstruction   Family History  Problem Relation Age of Onset  . Diabetes Mother   . Hypertension Mother   . Prostate cancer Father   . Hypertension Father   . Diabetes Father   .  Diabetes Father   . Breast cancer Paternal Aunt     x 2   History  Substance Use Topics  . Smoking status: Current Every Day Smoker    Types: Cigars  . Smokeless tobacco: Not on file  . Alcohol Use: Yes     Comment: occasional beer   OB History    No data available     Review of Systems  All other systems reviewed and are negative.     Allergies  Cymbalta; Klonopin; Codeine; and Latex  Home Medications   Prior to Admission medications   Medication Sig Start Date End Date Taking? Authorizing Provider  albuterol (PROVENTIL HFA;VENTOLIN HFA) 108 (90 BASE) MCG/ACT inhaler Inhale 2 puffs into the lungs every 4 (four) hours as needed for wheezing or shortness of breath. 11/30/13   Janne Napoleon, NP  amLODipine (NORVASC) 10 MG tablet Take 10 mg by mouth daily.      Historical Provider, MD  aspirin 325 MG tablet Take 325 mg by mouth daily.      Historical Provider, MD  beclomethasone (QVAR) 80 MCG/ACT inhaler Inhale 2 puffs into the lungs 2 (two) times daily. 08/04/13   Melony Overly, MD  hydrochlorothiazide (HYDRODIURIL) 12.5 MG tablet Take 12.5 mg by mouth daily.      Historical Provider, MD  meloxicam (MOBIC) 15 MG tablet Take 1 tablet (15 mg  total) by mouth daily. 08/04/13   Melony Overly, MD  predniSONE (DELTASONE) 20 MG tablet Take 3 tabs po on first day, 2 tabs second day, 2 tabs third day, 1 tab fourth day, 1 tab 5th day. Take with food. 11/30/13   Janne Napoleon, NP  rosuvastatin (CRESTOR) 10 MG tablet Take 10 mg by mouth daily.      Historical Provider, MD   BP 147/87 mmHg  Pulse 82  Temp(Src) 98.3 F (36.8 C) (Oral)  Resp 20  SpO2 100% Physical Exam  Constitutional: She appears well-developed and well-nourished.  HENT:  Head: Normocephalic and atraumatic.  Right Ear: Tympanic membrane and ear canal normal.  Left Ear: Tympanic membrane and ear canal normal.  Mouth/Throat: Oropharynx is clear and moist.  Neck: Normal range of motion. Neck supple.  Cardiovascular: Normal  rate, regular rhythm and normal heart sounds.   Pulmonary/Chest: Effort normal. She has wheezes.  Diffuse expiratory wheezing Patient speaking in complete sentences   Musculoskeletal: Normal range of motion.  Neurological: She is alert.  Skin: Skin is warm and dry.  Psychiatric: She has a normal mood and affect.  Nursing note and vitals reviewed.   ED Course  Procedures (including critical care time) Labs Review Labs Reviewed - No data to display  Imaging Review No results found.   EKG Interpretation None     8:58 AM Reassessed patient.  She reports that her breathing has significantly improved.  Lungs continue to have diffuse wheezing, but has improved. 10:04 AM Reassessed patient.  She reports improvement.  Lungs continue to have mild diffuse wheezing.  Will order one more breathing treatment and reassess.   10:52 AM Reassessed patient.  She reports that she is feeling much better at this time.  Lungs with only slight wheezing at this time.  She feels that she is breathing at baseline. MDM   Final diagnoses:  None   Patient presents with Asthma Exacerbation.   No current signs of respiratory distress. Pulse ox 100 on RA.  Lung exam improved after nebulizer treatment. Prednisone given in the ED and pt will bd dc with 5 day burst. Pt states they are breathing at baseline. Pt has been instructed to continue using prescribed medications and to speak with PCP about today's exacerbation.  Stable for discharge.  Return precautions given.     Hyman Bible, PA-C 12/27/13 [place], MD 12/27/13 [phone]

## 2014-02-19 ENCOUNTER — Ambulatory Visit (HOSPITAL_COMMUNITY)
Admission: RE | Admit: 2014-02-19 | Discharge: 2014-02-19 | Disposition: A | Discharge status: Home / Self Care | Payer: Medicare Other | Source: Ambulatory Visit | Attending: Family Medicine | Admitting: Family Medicine

## 2014-02-19 DIAGNOSIS — Z1231 Encounter for screening mammogram for malignant neoplasm of breast: Secondary | ICD-10-CM | POA: Diagnosis present

## 2014-07-14 ENCOUNTER — Encounter (HOSPITAL_COMMUNITY): Payer: Self-pay | Admitting: Emergency Medicine

## 2014-07-14 ENCOUNTER — Emergency Department (INDEPENDENT_AMBULATORY_CARE_PROVIDER_SITE_OTHER)
Admission: EM | Admit: 2014-07-14 | Discharge: 2014-07-14 | Disposition: A | Discharge status: Nursing Home (Includes ICF) | Payer: Medicare Other | Source: Home / Self Care | Attending: Family Medicine | Admitting: Family Medicine

## 2014-07-14 ENCOUNTER — Emergency Department (HOSPITAL_COMMUNITY)
Admission: EM | Admit: 2014-07-14 | Discharge: 2014-07-15 | Disposition: A | Discharge status: Home / Self Care | Payer: Medicare Other | Attending: Emergency Medicine | Admitting: Emergency Medicine

## 2014-07-14 ENCOUNTER — Encounter (HOSPITAL_COMMUNITY): Payer: Self-pay | Admitting: *Deleted

## 2014-07-14 DIAGNOSIS — R1033 Periumbilical pain: Secondary | ICD-10-CM

## 2014-07-14 DIAGNOSIS — Z862 Personal history of diseases of the blood and blood-forming organs and certain disorders involving the immune mechanism: Secondary | ICD-10-CM | POA: Insufficient documentation

## 2014-07-14 DIAGNOSIS — J45909 Unspecified asthma, uncomplicated: Secondary | ICD-10-CM | POA: Diagnosis not present

## 2014-07-14 DIAGNOSIS — Z8659 Personal history of other mental and behavioral disorders: Secondary | ICD-10-CM | POA: Insufficient documentation

## 2014-07-14 DIAGNOSIS — N8320 Unspecified ovarian cysts: Secondary | ICD-10-CM | POA: Insufficient documentation

## 2014-07-14 DIAGNOSIS — Z8719 Personal history of other diseases of the digestive system: Secondary | ICD-10-CM | POA: Diagnosis not present

## 2014-07-14 DIAGNOSIS — G9050 Complex regional pain syndrome I, unspecified: Secondary | ICD-10-CM | POA: Diagnosis not present

## 2014-07-14 DIAGNOSIS — Z7982 Long term (current) use of aspirin: Secondary | ICD-10-CM | POA: Insufficient documentation

## 2014-07-14 DIAGNOSIS — Z3202 Encounter for pregnancy test, result negative: Secondary | ICD-10-CM | POA: Diagnosis not present

## 2014-07-14 DIAGNOSIS — E785 Hyperlipidemia, unspecified: Secondary | ICD-10-CM | POA: Insufficient documentation

## 2014-07-14 DIAGNOSIS — Z720 Tobacco use: Secondary | ICD-10-CM | POA: Diagnosis not present

## 2014-07-14 DIAGNOSIS — I10 Essential (primary) hypertension: Secondary | ICD-10-CM | POA: Diagnosis not present

## 2014-07-14 DIAGNOSIS — Z8673 Personal history of transient ischemic attack (TIA), and cerebral infarction without residual deficits: Secondary | ICD-10-CM | POA: Insufficient documentation

## 2014-07-14 DIAGNOSIS — Z7951 Long term (current) use of inhaled steroids: Secondary | ICD-10-CM | POA: Insufficient documentation

## 2014-07-14 DIAGNOSIS — R1032 Left lower quadrant pain: Secondary | ICD-10-CM

## 2014-07-14 DIAGNOSIS — Z91040 Latex allergy status: Secondary | ICD-10-CM | POA: Diagnosis not present

## 2014-07-14 DIAGNOSIS — Z79899 Other long term (current) drug therapy: Secondary | ICD-10-CM | POA: Insufficient documentation

## 2014-07-14 DIAGNOSIS — Z87448 Personal history of other diseases of urinary system: Secondary | ICD-10-CM | POA: Insufficient documentation

## 2014-07-14 DIAGNOSIS — R1030 Lower abdominal pain, unspecified: Secondary | ICD-10-CM | POA: Diagnosis present

## 2014-07-14 DIAGNOSIS — N83202 Unspecified ovarian cyst, left side: Secondary | ICD-10-CM

## 2014-07-14 DIAGNOSIS — E669 Obesity, unspecified: Secondary | ICD-10-CM | POA: Insufficient documentation

## 2014-07-14 DIAGNOSIS — R109 Unspecified abdominal pain: Secondary | ICD-10-CM

## 2014-07-14 LAB — COMPREHENSIVE METABOLIC PANEL
ALT: 14 U/L (ref 14–54)
AST: 17 U/L (ref 15–41)
Albumin: 3.1 g/dL — ABNORMAL LOW (ref 3.5–5.0)
Alkaline Phosphatase: 80 U/L (ref 38–126)
Anion gap: 6 (ref 5–15)
BUN: 7 mg/dL (ref 6–20)
CO2: 26 mmol/L (ref 22–32)
Calcium: 8.7 mg/dL — ABNORMAL LOW (ref 8.9–10.3)
Chloride: 103 mmol/L (ref 101–111)
Creatinine, Ser: 1.26 mg/dL — ABNORMAL HIGH (ref 0.44–1.00)
GFR calc Af Amer: 56 mL/min — ABNORMAL LOW (ref >60)
GFR calc non Af Amer: 48 mL/min — ABNORMAL LOW (ref >60)
Glucose, Bld: 91 mg/dL (ref 65–99)
Potassium: 4.2 mmol/L (ref 3.5–5.1)
Sodium: 135 mmol/L (ref 135–145)
Total Bilirubin: 0.4 mg/dL (ref 0.3–1.2)
Total Protein: 7 g/dL (ref 6.5–8.1)

## 2014-07-14 LAB — LIPASE, BLOOD: Lipase: 17 U/L — ABNORMAL LOW (ref 22–51)

## 2014-07-14 LAB — CBC WITH DIFFERENTIAL/PLATELET
Basophils Absolute: 0 10*3/uL (ref 0.0–0.1)
Basophils Relative: 0 % (ref 0–1)
Eosinophils Absolute: 0.6 10*3/uL (ref 0.0–0.7)
Eosinophils Relative: 4 % (ref 0–5)
HCT: 36.3 % (ref 36.0–46.0)
Hemoglobin: 11.7 g/dL — ABNORMAL LOW (ref 12.0–15.0)
Lymphocytes Relative: 20 % (ref 12–46)
Lymphs Abs: 2.9 10*3/uL (ref 0.7–4.0)
MCH: 25.8 pg — ABNORMAL LOW (ref 26.0–34.0)
MCHC: 32.2 g/dL (ref 30.0–36.0)
MCV: 80 fL (ref 78.0–100.0)
Monocytes Absolute: 0.9 10*3/uL (ref 0.1–1.0)
Monocytes Relative: 6 % (ref 3–12)
Neutro Abs: 10.1 10*3/uL — ABNORMAL HIGH (ref 1.7–7.7)
Neutrophils Relative %: 70 % (ref 43–77)
Platelets: 308 10*3/uL (ref 150–400)
RBC: 4.54 MIL/uL (ref 3.87–5.11)
RDW: 16 % — ABNORMAL HIGH (ref 11.5–15.5)
WBC: 14.5 10*3/uL — ABNORMAL HIGH (ref 4.0–10.5)

## 2014-07-14 LAB — URINALYSIS, ROUTINE W REFLEX MICROSCOPIC
Glucose, UA: NEGATIVE mg/dL
Hgb urine dipstick: NEGATIVE
Ketones, ur: NEGATIVE mg/dL
Nitrite: NEGATIVE
Protein, ur: NEGATIVE mg/dL
Specific Gravity, Urine: 1.024 (ref 1.005–1.030)
Urobilinogen, UA: 0.2 mg/dL (ref 0.0–1.0)
pH: 5 (ref 5.0–8.0)

## 2014-07-14 LAB — URINE MICROSCOPIC-ADD ON

## 2014-07-14 LAB — POC URINE PREG, ED: Preg Test, Ur: NEGATIVE

## 2014-07-14 MED ORDER — IOHEXOL 300 MG/ML  SOLN
25.0000 mL | Freq: Once | INTRAMUSCULAR | Status: AC | PRN
  Administered 2014-07-14: 25 mL via ORAL

## 2014-07-14 MED ORDER — MORPHINE SULFATE 4 MG/ML IJ SOLN
4.0000 mg | Freq: Once | INTRAMUSCULAR | Status: AC
  Administered 2014-07-14: 4 mg via INTRAVENOUS
  Filled 2014-07-14: qty 1

## 2014-07-14 MED ORDER — ONDANSETRON HCL 4 MG/2ML IJ SOLN
4.0000 mg | Freq: Once | INTRAMUSCULAR | Status: AC
  Administered 2014-07-14: 4 mg via INTRAVENOUS
  Filled 2014-07-14: qty 2

## 2014-07-14 NOTE — ED Provider Notes (Signed)
CSN: [id number]     Arrival date & time 07/14/14  1949 History  This chart was scribed for Merryl Hacker, MD by Rayfield Citizen, ED Scribe. This patient was seen in room B18C/B18C and the patient's care was started at 11:05 PM.    Chief Complaint  Patient presents with  . Abdominal Pain   The history is provided by the patient. No language interpreter was used.    HPI Comments: Judith Mccullough is a 54 y.o. female with past medical history of HTN, HLD, who presents to the Emergency Department complaining of constant waxing and waning ("dull, then sharp") lower abdominal pain, rated 8/10 beginning around 03:00 this morning. Patient states she took Aleve this morning without relief. She reports associated nausea. She denies fever, chills, vomiting, hematuria, dysuria,  lower back pain. Reports diarrhea. She notes small amount of blood in her stool several days prior after constipation; denies any other concerns at this time.   Patient states she is still menstruating regularly.  LMP April.  She is an occasional EtOH user, nonsmoker. She reports allergies to codeine. Prior surgical history includes cholecystectomy.   Past Medical History  Diagnosis Date  . Asthma   . Hypertension   . Anxiety   . CVA (cerebral infarction)   . Unspecified asthma(493.90) 03/17/2011  . Polysubstance abuse 03/17/2011    positive cocaine 08/07/10 er visit; States she has never had problems with drugs.  . Hyperlipidemia 03/17/2011  . Anemia, unspecified 03/17/2011  . Aortic regurgitation 03/17/2011    Pt. denies problem with her heart.  . Mitral valve regurgitation 03/17/2011    Pt. said she never had problem with her heart  . Depression 03/17/2011  . Peripheral neuropathy 03/17/2011    Pt. denies this problem  . Diverticulitis   . Diverticulosis   . Complex regional pain syndrome   . Renal insufficiency syndrome   . Blood clot in vein    Past Surgical History  Procedure Laterality Date  . Knee arthroscopy Right  1992  . Orthopedic surgery    . Cholecystectomy    . Thumb surgery Right 2003    R thumb reconstruction   Family History  Problem Relation Age of Onset  . Diabetes Mother   . Hypertension Mother   . Prostate cancer Father   . Hypertension Father   . Diabetes Father   . Diabetes Father   . Breast cancer Paternal Aunt     x 2   History  Substance Use Topics  . Smoking status: Current Every Day Smoker    Types: Cigars  . Smokeless tobacco: Not on file  . Alcohol Use: Yes     Comment: occasional beer   OB History    No data available     Review of Systems  Constitutional: Negative for fever.  Respiratory: Negative for chest tightness and shortness of breath.   Cardiovascular: Negative for chest pain.  Gastrointestinal: Positive for nausea and abdominal pain. Negative for vomiting and diarrhea.  Genitourinary: Negative for dysuria, urgency and frequency.  Neurological: Negative for headaches.  Psychiatric/Behavioral: Negative for confusion.  All other systems reviewed and are negative.     Allergies  Cymbalta; Klonopin; Codeine; and Latex  Home Medications   Prior to Admission medications   Medication Sig Start Date End Date Taking? Authorizing Provider  albuterol (PROVENTIL HFA;VENTOLIN HFA) 108 (90 BASE) MCG/ACT inhaler Inhale 2 puffs into the lungs every 4 (four) hours as needed for wheezing or shortness of breath. 11/30/13  Yes Janne Napoleon, NP  albuterol (PROVENTIL) (2.5 MG/3ML) 0.083% nebulizer solution Take 2.5 mg by nebulization every 6 (six) hours as needed for wheezing or shortness of breath.   Yes Historical Provider, MD  amLODipine (NORVASC) 10 MG tablet Take 10 mg by mouth daily.     Yes Historical Provider, MD  aspirin EC 81 MG tablet Take 81 mg by mouth daily.   Yes Historical Provider, MD  Fluticasone-Salmeterol (ADVAIR) 250-50 MCG/DOSE AEPB Inhale 1 puff into the lungs 2 (two) times daily.   Yes Historical Provider, MD  hydrOXYzine (VISTARIL) 25 MG  capsule Take 25 mg by mouth 2 (two) times daily as needed for anxiety.  12/15/13  Yes Historical Provider, MD  montelukast (SINGULAIR) 10 MG tablet Take 10 mg by mouth every morning.   Yes Historical Provider, MD  rosuvastatin (CRESTOR) 10 MG tablet Take 10 mg by mouth daily.     Yes Historical Provider, MD  meloxicam (MOBIC) 15 MG tablet Take 1 tablet (15 mg total) by mouth daily. Patient not taking: Reported on 12/27/2013 08/04/13   Melony Overly, MD  oxyCODONE-acetaminophen (PERCOCET/ROXICET) 5-325 MG per tablet Take 1 tablet by mouth every 6 (six) hours as needed for severe pain. 07/15/14   Merryl Hacker, MD   BP 103/60 mmHg  Pulse 80  Temp(Src) 98.8 F (37.1 C) (Oral)  Resp 18  SpO2 97%  LMP  (Within Months) Physical Exam  Constitutional: She is oriented to person, place, and time. She appears well-developed and well-nourished.  Obese  HENT:  Head: Normocephalic and atraumatic.  Cardiovascular: Normal rate, regular rhythm and normal heart sounds.   No murmur heard. Pulmonary/Chest: Effort normal and breath sounds normal. No respiratory distress. She has no wheezes.  Abdominal: Soft. Bowel sounds are normal. There is tenderness. There is no rebound and no guarding.  Midline periumbilical tenderness to palpation without rebound or guarding  Genitourinary: Vagina normal.  Scant vaginal discharge, no adnexal tenderness  Neurological: She is alert and oriented to person, place, and time.  Skin: Skin is warm and dry.  Psychiatric: She has a normal mood and affect.  Nursing note and vitals reviewed.   ED Course  Procedures   DIAGNOSTIC STUDIES: Oxygen Saturation is 93% on RA, low by my interpretation.    COORDINATION OF CARE: 11:09 PM Discussed treatment plan with pt at bedside and pt agreed to plan.   Labs Review Labs Reviewed  WET PREP, GENITAL - Abnormal; Notable for the following:    WBC, Wet Prep HPF POC MANY (*)    All other components within normal limits  CBC  WITH DIFFERENTIAL/PLATELET - Abnormal; Notable for the following:    WBC 14.5 (*)    Hemoglobin 11.7 (*)    MCH 25.8 (*)    RDW 16.0 (*)    Neutro Abs 10.1 (*)    All other components within normal limits  COMPREHENSIVE METABOLIC PANEL - Abnormal; Notable for the following:    Creatinine, Ser 1.26 (*)    Calcium 8.7 (*)    Albumin 3.1 (*)    GFR calc non Af Amer 48 (*)    GFR calc Af Amer 56 (*)    All other components within normal limits  LIPASE, BLOOD - Abnormal; Notable for the following:    Lipase 17 (*)    All other components within normal limits  URINALYSIS, ROUTINE W REFLEX MICROSCOPIC (NOT AT [facility]) - Abnormal; Notable for the following:    APPearance HAZY (*)  Bilirubin Urine SMALL (*)    Leukocytes, UA MODERATE (*)    All other components within normal limits  URINE MICROSCOPIC-ADD ON - Abnormal; Notable for the following:    Squamous Epithelial / LPF MANY (*)    Bacteria, UA FEW (*)    All other components within normal limits  POC URINE PREG, ED  GC/CHLAMYDIA PROBE AMP ([place]) NOT AT [facility]    Imaging Review US Pelvis Complete  07/15/2014   CLINICAL DATA:  Abdominal pain.  EXAM: TRANSABDOMINAL ULTRASOUND OF PELVIS  TECHNIQUE: Transabdominal ultrasound examination of the pelvis was performed including evaluation of the uterus, ovaries, adnexal regions, and pelvic cul-de-sac.  COMPARISON:  CT 07/15/2014  FINDINGS: Uterus  Measurements: 13.7 x 9.9 x 8.5 cm. There is a posterior midbody fibroid measuring 6.8 x 7.4 x 7.8 cm  Endometrium  Thickness: 10.5 mm.  No focal abnormality visualized.  Right ovary  not visualized.  Left ovary  There is a loculated 6.0 x 6.7 x 4.9 cm left adnexal cystic lesion, with no normal appearing ovarian tissue visible. This corresponds to the abnormality observed on CT. The lesion exhibits thick interlobular septi. No solid nodule. Intact perfusion.  Other findings:  No free fluid  IMPRESSION: 1. Multilocular left adnexal cystic lesion  measuring 6.7 cm. Since these may be difficult to assess completely with [place], further evaluation with MRI or surgical evaluation is recommended. 2. 7.8 cm posterior uterine fibroid   Electronically Signed   By: Andreas Newport M.D.   On: 07/15/2014 04:53   Ct Abdomen Pelvis W Contrast  07/15/2014   CLINICAL DATA:  Severe waxing and waning epigastric pain beginning around 3 a.m., nausea and vomiting. History of hypertension, stroke, diverticulosis/ diverticulitis, complex regional pain syndrome.  EXAM: CT ABDOMEN AND PELVIS WITH CONTRAST  TECHNIQUE: Multidetector CT imaging of the abdomen and pelvis was performed using the standard protocol following bolus administration of intravenous contrast.  CONTRAST:  21mL OMNIPAQUE IOHEXOL 300 MG/ML  SOLN  COMPARISON:  CT abdomen and pelvis July 31, 2012  FINDINGS: LUNG BASES: Included view of the lung bases are clear. Mild centrilobular emphysema. Visualized heart and pericardium are unremarkable.  SOLID ORGANS: The liver is mildly enlarged, with mild intrahepatic biliary dilatation, status post cholecystectomy. Subcentimeter probable segment for. Mild heterogeneous lung attenuation may be related to bolus timing. Spleen, gallbladder, pancreas and adrenal glands are unremarkable.  GASTROINTESTINAL TRACT: The stomach, small and large bowel are normal in course and caliber without inflammatory changes. Normal appendix.  KIDNEYS/ URINARY TRACT: Kidneys are orthotopic, demonstrating symmetric enhancement. Punctate RIGHT lower pole nephrolithiasis. No hydronephrosis or solid renal masses. Too small to characterize hypodensity upper pole RIGHT kidney. Focal scarring RIGHT upper pole. The unopacified ureters are normal in course and caliber. Delayed imaging through the kidneys demonstrates symmetric prompt contrast excretion within the proximal urinary collecting system. Urinary bladder is partially distended and unremarkable.  PERITONEUM/RETROPERITONEUM: Aortoiliac vessels are  normal in course and caliber, mild calcific atherosclerosis. No lymphadenopathy by CT size criteria. Enlarged leiomyomatosis uterus with at least 1 distinct 7.2 x 7.2 cm posterior intramural leiomyoma. In addition, septated 3.5 x 5.6 cm LEFT adnexal cyst. No intraperitoneal free fluid nor free air.  SOFT TISSUE/OSSEOUS STRUCTURES: Non-suspicious. Anterior abdominal wall ligamentous laxity, tiny fat containing paraumbilical hernia. Severe lower lumbar spine facet arthropathy resulting in moderate L4-5 neural foraminal narrowing.  IMPRESSION: Punctate nonobstructing RIGHT lower pole nephrolithiasis.  Hepatomegaly, status post cholecystectomy.  Enlarged leiomyomatous uterus. 3.5 x 5.6 cm septated LEFT adnexal  cyst for which follow-up pelvic ultrasound is recommended for further characterization.   Electronically Signed   By: Elon Alas M.D.   On: 07/15/2014 02:20     EKG Interpretation None      MDM   Final diagnoses:  Periumbilical abdominal pain  Cyst of left ovary   Patient presents with abdominal pain. Was seen in urgent care earlier today and sent here to rule out diverticulitis. Well-appearing on exam. Nontoxic. No evidence of fever. Patient does have leukocytosis to 18.5. She is tender on exam. No urinary symptoms. She does have 11-20 white cells in the urine but without symptoms, will send culture. Patient given pain and nausea medication.  CT scan without evidence of diverticulitis. She does have evidence of a large left ovarian cyst and nonobstructing nephrolithiasis. Ultrasound obtained to better characterize the cyst. Ultrasound is somewhat nondiagnostic an MRI versus surgical evaluation recommended. Pelvic exam largely unremarkable. Patient given follow-up to OB/GYN. She will be discharged with pain medication.  After history, exam, and medical workup I feel the patient has been appropriately medically screened and is safe for discharge home. Pertinent diagnoses were discussed with  the patient. Patient was given return precautions.  I personally performed the services described in this documentation, which was scribed in my presence. The recorded information has been reviewed and is accurate.      Merryl Hacker, MD 07/15/14 [phone]

## 2014-07-14 NOTE — ED Notes (Signed)
Pt. reports mid/low abdominal pain with nausea onset today , denies emesis or diarrhea , no fever or chills.

## 2014-07-14 NOTE — ED Notes (Signed)
MD at bedside. 

## 2014-07-14 NOTE — ED Notes (Signed)
CT notified pt done with as much contrast as she will drink.  Pt refusing to drink anymore.  Sts it is increasing her pain and nausea.  This RN encouraged her to drink.  Pt continues to refuse.

## 2014-07-14 NOTE — ED Provider Notes (Addendum)
CSN: [id number]     Arrival date & time 07/14/14  47 History   First MD Initiated Contact with Patient 07/14/14 1851     Chief Complaint  Patient presents with  . Abdominal Pain   (Consider location/radiation/quality/duration/timing/severity/associated sxs/prior Treatment) Patient is a 54 y.o. female presenting with abdominal pain. The history is provided by the patient.  Abdominal Pain Pain location:  LLQ Pain quality: cramping   Pain severity:  Moderate Onset quality:  Gradual Duration: awoke from sleep at 3am today. Progression:  Worsening Chronicity:  Recurrent Relieved by:  None tried Worsened by:  Nothing tried Ineffective treatments:  None tried Associated symptoms: constipation and nausea   Associated symptoms: no diarrhea, no fever, no hematochezia, no melena, no vaginal bleeding, no vaginal discharge and no vomiting   Risk factors comment:  Had bm this am with sl improvement of sx., h/o divertic   Past Medical History  Diagnosis Date  . Asthma   . Hypertension   . Anxiety   . CVA (cerebral infarction)   . Unspecified asthma(493.90) 03/17/2011  . Polysubstance abuse 03/17/2011    positive cocaine 08/07/10 er visit; States she has never had problems with drugs.  . Hyperlipidemia 03/17/2011  . Anemia, unspecified 03/17/2011  . Aortic regurgitation 03/17/2011    Pt. denies problem with her heart.  . Mitral valve regurgitation 03/17/2011    Pt. said she never had problem with her heart  . Depression 03/17/2011  . Peripheral neuropathy 03/17/2011    Pt. denies this problem  . Diverticulitis   . Diverticulosis   . Complex regional pain syndrome   . Renal insufficiency syndrome   . Blood clot in vein    Past Surgical History  Procedure Laterality Date  . Knee arthroscopy Right 1992  . Orthopedic surgery    . Cholecystectomy    . Thumb surgery Right 2003    R thumb reconstruction   Family History  Problem Relation Age of Onset  . Diabetes Mother   . Hypertension Mother    . Prostate cancer Father   . Hypertension Father   . Diabetes Father   . Diabetes Father   . Breast cancer Paternal Aunt     x 2   History  Substance Use Topics  . Smoking status: Current Every Day Smoker    Types: Cigars  . Smokeless tobacco: Not on file  . Alcohol Use: Yes     Comment: occasional beer   OB History    No data available     Review of Systems  Constitutional: Negative.  Negative for fever.  Gastrointestinal: Positive for nausea, abdominal pain and constipation. Negative for vomiting, diarrhea, blood in stool, melena, hematochezia and anal bleeding.  Genitourinary: Negative for vaginal bleeding and vaginal discharge.    Allergies  Cymbalta; Klonopin; Codeine; and Latex  Home Medications   Prior to Admission medications   Medication Sig Start Date End Date Taking? Authorizing Provider  albuterol (PROVENTIL HFA;VENTOLIN HFA) 108 (90 BASE) MCG/ACT inhaler Inhale 2 puffs into the lungs every 4 (four) hours as needed for wheezing or shortness of breath. 11/30/13   Janne Napoleon, NP  albuterol (PROVENTIL) (2.5 MG/3ML) 0.083% nebulizer solution Take 2.5 mg by nebulization every 6 (six) hours as needed for wheezing or shortness of breath.    Historical Provider, MD  amLODipine (NORVASC) 10 MG tablet Take 10 mg by mouth daily.      Historical Provider, MD  aspirin 325 MG tablet Take 325 mg by mouth daily.  Historical Provider, MD  beclomethasone (QVAR) 80 MCG/ACT inhaler Inhale 2 puffs into the lungs 2 (two) times daily. Patient not taking: Reported on 12/27/2013 08/04/13   Melony Overly, MD  fexofenadine (ALLEGRA) 180 MG tablet Take 180 mg by mouth daily.    Historical Provider, MD  hydrOXYzine (VISTARIL) 25 MG capsule Take 25 mg by mouth 2 (two) times daily as needed. 12/15/13   Historical Provider, MD  meloxicam (MOBIC) 15 MG tablet Take 1 tablet (15 mg total) by mouth daily. Patient not taking: Reported on 12/27/2013 08/04/13   Melony Overly, MD  predniSONE  (DELTASONE) 20 MG tablet Take 3 tablets (60 mg total) by mouth daily. 12/27/13   Heather Laisure, PA-C  rosuvastatin (CRESTOR) 10 MG tablet Take 10 mg by mouth daily.      Historical Provider, MD   BP 134/66 mmHg  Pulse 98  Temp(Src) 98.7 F (37.1 C) (Oral)  Resp 20  SpO2 99%  LMP  (Within Months) Physical Exam  Constitutional: She appears well-developed and well-nourished. No distress.  Abdominal: Soft. Normal appearance. She exhibits no distension and no mass. Bowel sounds are decreased. There is tenderness in the suprapubic area and left lower quadrant. There is no rigidity, no rebound, no guarding and no CVA tenderness.    Neurological: She is alert.  Skin: Skin is warm and dry.  Nursing note and vitals reviewed.   ED Course  Procedures (including critical care time) Labs Review Labs Reviewed - No data to display  Imaging Review No results found.   MDM   1. Left lower quadrant pain    Sent for eval of prob diverticulitis.    Billy Fischer, MD 07/14/14 [id number]  Billy Fischer, MD 07/14/14 [phone]

## 2014-07-14 NOTE — ED Notes (Signed)
Pt  Reports  abd  Pain  With  Nausea  -   No vomiting    No  Bleeding  Or    Discharge   Pt   Reports     Symptoms  Began last  Am

## 2014-07-15 ENCOUNTER — Emergency Department (HOSPITAL_COMMUNITY): Payer: Medicare Other

## 2014-07-15 ENCOUNTER — Encounter (HOSPITAL_COMMUNITY): Payer: Self-pay

## 2014-07-15 DIAGNOSIS — N8320 Unspecified ovarian cysts: Secondary | ICD-10-CM | POA: Diagnosis not present

## 2014-07-15 LAB — WET PREP, GENITAL
Clue Cells Wet Prep HPF POC: NONE SEEN
Trich, Wet Prep: NONE SEEN
Yeast Wet Prep HPF POC: NONE SEEN

## 2014-07-15 LAB — GC/CHLAMYDIA PROBE AMP (~~LOC~~) NOT AT ARMC
Chlamydia: NEGATIVE
Neisseria Gonorrhea: NEGATIVE

## 2014-07-15 MED ORDER — OXYCODONE-ACETAMINOPHEN 5-325 MG PO TABS
1.0000 | ORAL_TABLET | Freq: Once | ORAL | Status: AC
  Administered 2014-07-15: 1 via ORAL
  Filled 2014-07-15: qty 1

## 2014-07-15 MED ORDER — OXYCODONE-ACETAMINOPHEN 5-325 MG PO TABS: 1.0000 | ORAL_TABLET | Freq: Four times a day (QID) | ORAL | Status: DC | PRN

## 2014-07-15 MED ORDER — IOHEXOL 300 MG/ML  SOLN
80.0000 mL | Freq: Once | INTRAMUSCULAR | Status: AC | PRN
  Administered 2014-07-15: 80 mL via INTRAVENOUS

## 2014-07-15 NOTE — Discharge Instructions (Signed)
You were seen today for abdominal pain. You were found to have a large ovarian cyst. You may need follow-up MRI need to follow-up with an OB/GYN for further evaluation. You'll be given pain medication.  Abdominal Pain, Women Abdominal (stomach, pelvic, or belly) pain can be caused by many things. It is important to tell your doctor:  The location of the pain.  Does it come and go or is it present all the time?  Are there things that start the pain (eating certain foods, exercise)?  Are there other symptoms associated with the pain (fever, nausea, vomiting, diarrhea)? All of this is helpful to know when trying to find the cause of the pain. CAUSES   Stomach: virus or bacteria infection, or ulcer.  Intestine: appendicitis (inflamed appendix), regional ileitis (Crohn's disease), ulcerative colitis (inflamed colon), irritable bowel syndrome, diverticulitis (inflamed diverticulum of the colon), or cancer of the stomach or intestine.  Gallbladder disease or stones in the gallbladder.  Kidney disease, kidney stones, or infection.  Pancreas infection or cancer.  Fibromyalgia (pain disorder).  Diseases of the female organs:  Uterus: fibroid (non-cancerous) tumors or infection.  Fallopian tubes: infection or tubal pregnancy.  Ovary: cysts or tumors.  Pelvic adhesions (scar tissue).  Endometriosis (uterus lining tissue growing in the pelvis and on the pelvic organs).  Pelvic congestion syndrome (female organs filling up with blood just before the menstrual period).  Pain with the menstrual period.  Pain with ovulation (producing an egg).  Pain with an IUD (intrauterine device, birth control) in the uterus.  Cancer of the female organs.  Functional pain (pain not caused by a disease, may improve without treatment).  Psychological pain.  Depression. DIAGNOSIS  Your doctor will decide the seriousness of your pain by doing an examination.  Blood  tests.  X-rays.  Ultrasound.  CT scan (computed tomography, special type of X-ray).  MRI (magnetic resonance imaging).  Cultures, for infection.  Barium enema (dye inserted in the large intestine, to better view it with X-rays).  Colonoscopy (looking in intestine with a lighted tube).  Laparoscopy (minor surgery, looking in abdomen with a lighted tube).  Major abdominal exploratory surgery (looking in abdomen with a large incision). TREATMENT  The treatment will depend on the cause of the pain.   Many cases can be observed and treated at home.  Over-the-counter medicines recommended by your caregiver.  Prescription medicine.  Antibiotics, for infection.  Birth control pills, for painful periods or for ovulation pain.  Hormone treatment, for endometriosis.  Nerve blocking injections.  Physical therapy.  Antidepressants.  Counseling with a psychologist or psychiatrist.  Minor or major surgery. HOME CARE INSTRUCTIONS   Do not take laxatives, unless directed by your caregiver.  Take over-the-counter pain medicine only if ordered by your caregiver. Do not take aspirin because it can cause an upset stomach or bleeding.  Try a clear liquid diet (broth or water) as ordered by your caregiver. Slowly move to a bland diet, as tolerated, if the pain is related to the stomach or intestine.  Have a thermometer and take your temperature several times a day, and record it.  Bed rest and sleep, if it helps the pain.  Avoid sexual intercourse, if it causes pain.  Avoid stressful situations.  Keep your follow-up appointments and tests, as your caregiver orders.  If the pain does not go away with medicine or surgery, you may try:  Acupuncture.  Relaxation exercises (yoga, meditation).  Group therapy.  Counseling. SEEK MEDICAL CARE IF:  You notice certain foods cause stomach pain.  Your home care treatment is not helping your pain.  You need stronger pain  medicine.  You want your IUD removed.  You feel faint or lightheaded.  You develop nausea and vomiting.  You develop a rash.  You are having side effects or an allergy to your medicine. SEEK IMMEDIATE MEDICAL CARE IF:   Your pain does not go away or gets worse.  You have a fever.  Your pain is felt only in portions of the abdomen. The right side could possibly be appendicitis. The left lower portion of the abdomen could be colitis or diverticulitis.  You are passing blood in your stools (bright red or black tarry stools, with or without vomiting).  You have blood in your urine.  You develop chills, with or without a fever.  You pass out. MAKE SURE YOU:   Understand these instructions.  Will watch your condition.  Will get help right away if you are not doing well or get worse. Document Released: 10/30/2006 Document Revised: 05/19/2013 Document Reviewed: 11/19/2008 [facility] Patient Information 2015 [place], [place]. This information is not intended to replace advice given to you by your health care provider. Make sure you discuss any questions you have with your health care provider.

## 2014-07-15 NOTE — ED Notes (Addendum)
Pt slightly agitated in room.  States "when are the coming for my scan?  I don't feel right, and I think my stomach is getting more swollen.  I might be tripping off of that medicine, but I just don't feel right".  MD made aware.

## 2014-07-15 NOTE — ED Notes (Signed)
Pt saturations showing 88-90% on monitor.  This RN went in and changed arms to other arm, pt laying on left side.  Saturations not improving with good pleth.  Pt placed on 2L [place]

## 2014-07-15 NOTE — ED Notes (Signed)
MD at bedside. 

## 2014-09-07 ENCOUNTER — Telehealth: Payer: Self-pay | Admitting: Neurology

## 2014-10-22 ENCOUNTER — Ambulatory Visit: Payer: Self-pay | Admitting: Neurology

## 2014-11-30 NOTE — Telephone Encounter (Signed)
Error

## 2015-02-26 ENCOUNTER — Other Ambulatory Visit: Payer: Self-pay

## 2015-02-26 DIAGNOSIS — Z1231 Encounter for screening mammogram for malignant neoplasm of breast: Secondary | ICD-10-CM

## 2015-03-19 ENCOUNTER — Ambulatory Visit
Admission: RE | Admit: 2015-03-19 | Discharge: 2015-03-19 | Disposition: A | Discharge status: Home / Self Care | Payer: Medicare Other | Source: Ambulatory Visit

## 2015-03-19 DIAGNOSIS — Z1231 Encounter for screening mammogram for malignant neoplasm of breast: Secondary | ICD-10-CM

## 2015-10-25 ENCOUNTER — Ambulatory Visit (HOSPITAL_COMMUNITY)
Admission: EM | Admit: 2015-10-25 | Discharge: 2015-10-25 | Disposition: A | Discharge status: Home / Self Care | Payer: Medicare Other | Attending: Internal Medicine | Admitting: Internal Medicine

## 2015-10-25 ENCOUNTER — Encounter (HOSPITAL_COMMUNITY): Payer: Self-pay | Admitting: Family Medicine

## 2015-10-25 DIAGNOSIS — G90511 Complex regional pain syndrome I of right upper limb: Secondary | ICD-10-CM

## 2015-10-25 MED ORDER — KETOROLAC TROMETHAMINE 60 MG/2ML IM SOLN
60.0000 mg | Freq: Once | INTRAMUSCULAR | Status: AC
  Administered 2015-10-25: 60 mg via INTRAMUSCULAR

## 2015-10-25 MED ORDER — KETOROLAC TROMETHAMINE 60 MG/2ML IM SOLN
INTRAMUSCULAR | Status: AC
  Filled 2015-10-25: qty 2

## 2015-10-25 MED ORDER — LIDOCAINE-HYDROCORTISONE ACE 3-0.5 % RE CREA: 1.0000 | TOPICAL_CREAM | Freq: Two times a day (BID) | RECTAL | 0 refills | Status: DC

## 2015-10-25 MED ORDER — GABAPENTIN 300 MG PO CAPS: 300.0000 mg | ORAL_CAPSULE | Freq: Three times a day (TID) | ORAL | 0 refills | Status: DC

## 2015-10-25 MED ORDER — OXYCODONE-ACETAMINOPHEN 10-325 MG PO TABS: 1.0000 | ORAL_TABLET | ORAL | 0 refills | Status: DC | PRN

## 2015-10-25 NOTE — ED Triage Notes (Signed)
Pt here for right arm pain that radiates into back and shoulder that started 2 days ago. Pt has chronic pain. sts that she has been taking aleve.

## 2015-10-26 NOTE — ED Provider Notes (Signed)
CSN: [id number]     Arrival date & time 10/25/15  1236 History   First MD Initiated Contact with Patient 10/25/15 1508     Chief Complaint  Patient presents with  . Arm Pain   (Consider location/radiation/quality/duration/timing/severity/associated sxs/prior Treatment) HPI Chronic problem Pt has RSD from hand crush injury. Has pain in arm, shoulder and back. Onset 2 days ago. Aleve no help. Past Medical History:  Diagnosis Date  . Anemia, unspecified 03/17/2011  . Anxiety   . Aortic regurgitation 03/17/2011   Pt. denies problem with her heart.  . Asthma   . Blood clot in vein   . Complex regional pain syndrome   . CVA (cerebral infarction)   . Depression 03/17/2011  . Diverticulitis   . Diverticulosis   . Hyperlipidemia 03/17/2011  . Hypertension   . Mitral valve regurgitation 03/17/2011   Pt. said she never had problem with her heart  . Peripheral neuropathy ([place]) 03/17/2011   Pt. denies this problem  . Polysubstance abuse 03/17/2011   positive cocaine 08/07/10 er visit; States she has never had problems with drugs.  . Renal insufficiency syndrome   . Unspecified asthma(493.90) 03/17/2011   Past Surgical History:  Procedure Laterality Date  . CHOLECYSTECTOMY    . KNEE ARTHROSCOPY Right 1992  . ORTHOPEDIC SURGERY    . thumb surgery Right 2003   R thumb reconstruction   Family History  Problem Relation Age of Onset  . Diabetes Mother   . Hypertension Mother   . Prostate cancer Father   . Hypertension Father   . Diabetes Father   . Breast cancer Paternal Aunt     x 2   Social History  Substance Use Topics  . Smoking status: Current Every Day Smoker    Types: Cigars  . Smokeless tobacco: Never Used  . Alcohol use Yes     Comment: occasional beer   OB History    No data available     Review of Systems  Denies: HEADACHE, NAUSEA, ABDOMINAL PAIN, CHEST PAIN, CONGESTION, DYSURIA, SHORTNESS OF BREATH  Allergies  Cymbalta [duloxetine hcl]; Klonopin [clonazepam]; Codeine; and  Latex  Home Medications   Prior to Admission medications   Medication Sig Start Date End Date Taking? Authorizing Provider  albuterol (PROVENTIL HFA;VENTOLIN HFA) 108 (90 BASE) MCG/ACT inhaler Inhale 2 puffs into the lungs every 4 (four) hours as needed for wheezing or shortness of breath. 11/30/13   Janne Napoleon, NP  albuterol (PROVENTIL) (2.5 MG/3ML) 0.083% nebulizer solution Take 2.5 mg by nebulization every 6 (six) hours as needed for wheezing or shortness of breath.    Historical Provider, MD  amLODipine (NORVASC) 10 MG tablet Take 10 mg by mouth daily.      Historical Provider, MD  aspirin EC 81 MG tablet Take 81 mg by mouth daily.    Historical Provider, MD  Fluticasone-Salmeterol (ADVAIR) 250-50 MCG/DOSE AEPB Inhale 1 puff into the lungs 2 (two) times daily.    Historical Provider, MD  gabapentin (NEURONTIN) 300 MG capsule Take 1 capsule (300 mg total) by mouth 3 (three) times daily. 10/25/15   Konrad Felix, PA  hydrOXYzine (VISTARIL) 25 MG capsule Take 25 mg by mouth 2 (two) times daily as needed for anxiety.  12/15/13   Historical Provider, MD  lidocaine-hydrocortisone (ANAMANTEL HC) 3-0.5 % CREA Place 1 Applicatorful rectally 2 (two) times daily. 10/25/15   Konrad Felix, PA  meloxicam (MOBIC) 15 MG tablet Take 1 tablet (15 mg total) by mouth daily. Patient  not taking: Reported on 12/27/2013 08/04/13   Melony Overly, MD  montelukast (SINGULAIR) 10 MG tablet Take 10 mg by mouth every morning.    Historical Provider, MD  oxyCODONE-acetaminophen (PERCOCET) 10-325 MG tablet Take 1 tablet by mouth every 4 (four) hours as needed for pain. 10/25/15   Konrad Felix, PA  oxyCODONE-acetaminophen (PERCOCET/ROXICET) 5-325 MG per tablet Take 1 tablet by mouth every 6 (six) hours as needed for severe pain. 07/15/14   Merryl Hacker, MD  rosuvastatin (CRESTOR) 10 MG tablet Take 10 mg by mouth daily.      Historical Provider, MD   Meds Ordered and Administered this Visit   Medications  ketorolac  (TORADOL) injection 60 mg (60 mg Intramuscular Given 10/25/15 1542)    BP 131/82   Pulse 84   Temp 97.9 F (36.6 C)   Resp 18   SpO2 100%  No data found.   Physical Exam NURSES NOTES AND VITAL SIGNS REVIEWED. CONSTITUTIONAL: Well developed, well nourished, no acute distress HEENT: normocephalic, atraumatic EYES: Conjunctiva normal NECK:normal ROM, supple, no adenopathy PULMONARY:No respiratory distress, normal effort ABDOMINAL: Soft, ND, NT BS+, No CVAT MUSCULOSKELETAL: Normal ROM of all extremities,  SKIN: warm and dry without rash PSYCHIATRIC: Mood and affect, behavior are normal  Urgent Care Course   Clinical Course    Procedures (including critical care time)  Labs Review Labs Reviewed - No data to display  Imaging Review No results found.   Visual Acuity Review  Right Eye Distance:   Left Eye Distance:   Bilateral Distance:    Right Eye Near:   Left Eye Near:    Bilateral Near:         MDM   1. Complex regional pain syndrome type 1 of right upper extremity     Patient is reassured that there are no issues that require transfer to higher level of care at this time or additional tests. Patient is advised to continue home symptomatic treatment. Patient is advised that if there are new or worsening symptoms to attend the emergency department, contact primary care provider, or return to UC. Instructions of care provided discharged home in stable condition.    THIS NOTE WAS GENERATED USING A VOICE RECOGNITION SOFTWARE PROGRAM. ALL REASONABLE EFFORTS  WERE MADE TO PROOFREAD THIS DOCUMENT FOR ACCURACY.  I have verbally reviewed the discharge instructions with the patient. A printed AVS was given to the patient.  All questions were answered prior to discharge.      Konrad Felix, [place] 10/26/15 [phone]

## 2016-02-22 ENCOUNTER — Other Ambulatory Visit: Payer: Self-pay | Admitting: Nurse Practitioner

## 2016-02-22 DIAGNOSIS — Z1231 Encounter for screening mammogram for malignant neoplasm of breast: Secondary | ICD-10-CM

## 2016-03-30 ENCOUNTER — Ambulatory Visit
Admission: RE | Admit: 2016-03-30 | Discharge: 2016-03-30 | Disposition: A | Discharge status: Home / Self Care | Payer: Medicare Other | Source: Ambulatory Visit | Attending: Nurse Practitioner | Admitting: Nurse Practitioner

## 2016-03-30 DIAGNOSIS — Z1231 Encounter for screening mammogram for malignant neoplasm of breast: Secondary | ICD-10-CM

## 2016-04-07 ENCOUNTER — Ambulatory Visit (HOSPITAL_COMMUNITY)
Admission: EM | Admit: 2016-04-07 | Discharge: 2016-04-07 | Disposition: A | Discharge status: Home / Self Care | Payer: Medicare Other | Attending: Family | Admitting: Family

## 2016-04-07 ENCOUNTER — Encounter (HOSPITAL_COMMUNITY): Payer: Self-pay | Admitting: Emergency Medicine

## 2016-04-07 DIAGNOSIS — R05 Cough: Secondary | ICD-10-CM

## 2016-04-07 DIAGNOSIS — J4541 Moderate persistent asthma with (acute) exacerbation: Secondary | ICD-10-CM | POA: Diagnosis not present

## 2016-04-07 DIAGNOSIS — R062 Wheezing: Secondary | ICD-10-CM | POA: Diagnosis not present

## 2016-04-07 DIAGNOSIS — R059 Cough, unspecified: Secondary | ICD-10-CM

## 2016-04-07 MED ORDER — METHYLPREDNISOLONE SODIUM SUCC 125 MG IJ SOLR
INTRAMUSCULAR | Status: AC
  Filled 2016-04-07: qty 2

## 2016-04-07 MED ORDER — METHYLPREDNISOLONE SODIUM SUCC 125 MG IJ SOLR
80.0000 mg | Freq: Once | INTRAMUSCULAR | Status: AC
  Administered 2016-04-07: 80 mg via INTRAMUSCULAR

## 2016-04-07 MED ORDER — IPRATROPIUM-ALBUTEROL 0.5-2.5 (3) MG/3ML IN SOLN
3.0000 mL | Freq: Once | RESPIRATORY_TRACT | Status: AC
  Administered 2016-04-07: 3 mL via RESPIRATORY_TRACT

## 2016-04-07 MED ORDER — PREDNISONE 20 MG PO TABS: 40.0000 mg | ORAL_TABLET | Freq: Every day | ORAL | 0 refills | Status: AC

## 2016-04-07 MED ORDER — IPRATROPIUM-ALBUTEROL 0.5-2.5 (3) MG/3ML IN SOLN
RESPIRATORY_TRACT | Status: AC
  Filled 2016-04-07: qty 3

## 2016-04-07 MED ORDER — METHYLPREDNISOLONE ACETATE 80 MG/ML IJ SUSP
INTRAMUSCULAR | Status: AC
  Filled 2016-04-07: qty 1

## 2016-04-07 NOTE — ED Triage Notes (Signed)
PT reports her asthma has worsened over last 3 days. PT has audible wheezing, but no respiratory distress.

## 2016-04-07 NOTE — ED Provider Notes (Signed)
CSN: [id number]     Arrival date & time 04/07/16  1204 History   First MD Initiated Contact with Patient 04/07/16 1315     Chief Complaint  Patient presents with  . Asthma   (Consider location/radiation/quality/duration/timing/severity/associated sxs/prior Treatment) 56 year old female presents with wheezing, cough and shortness of breath that started 3 days ago. Denies any fever, URI or GI symptoms. Has history of asthma and usually has a flare up of asthma symptoms in early spring each year. Has tried her Albuterol inhaler as well as her nebulizer at home with minimal relief. Also on Advair and Singulair for asthma maintenance. She is in no distress but has audible wheezes upon entering exam room. She has used Prednisone in the past with good success.    The history is provided by the patient.    Past Medical History:  Diagnosis Date  . Anemia, unspecified 03/17/2011  . Anxiety   . Aortic regurgitation 03/17/2011   Pt. denies problem with her heart.  . Asthma   . Blood clot in vein   . Complex regional pain syndrome   . CVA (cerebral infarction)   . Depression 03/17/2011  . Diverticulitis   . Diverticulosis   . Hyperlipidemia 03/17/2011  . Hypertension   . Mitral valve regurgitation 03/17/2011   Pt. said she never had problem with her heart  . Peripheral neuropathy ([place]) 03/17/2011   Pt. denies this problem  . Polysubstance abuse 03/17/2011   positive cocaine 08/07/10 er visit; States she has never had problems with drugs.  . Renal insufficiency syndrome   . Unspecified asthma(493.90) 03/17/2011   Past Surgical History:  Procedure Laterality Date  . CHOLECYSTECTOMY    . KNEE ARTHROSCOPY Right 1992  . ORTHOPEDIC SURGERY    . thumb surgery Right 2003   R thumb reconstruction   Family History  Problem Relation Age of Onset  . Diabetes Mother   . Hypertension Mother   . Prostate cancer Father   . Hypertension Father   . Diabetes Father   . Breast cancer Paternal Aunt     x 2  .  Breast cancer Maternal Aunt   . Breast cancer Maternal Aunt    Social History  Substance Use Topics  . Smoking status: Former Smoker    Types: Cigars  . Smokeless tobacco: Never Used  . Alcohol use Yes     Comment: occasional beer   OB History    No data available     Review of Systems  Constitutional: Positive for fatigue. Negative for appetite change, chills and fever.  HENT: Positive for postnasal drip. Negative for congestion, ear discharge, ear pain, sinus pain, sinus pressure, sneezing and sore throat.   Eyes: Negative for discharge.  Respiratory: Positive for cough, chest tightness, shortness of breath and wheezing.   Cardiovascular: Negative for chest pain.  Gastrointestinal: Negative for abdominal pain, diarrhea, nausea and vomiting.  Musculoskeletal: Negative for back pain and neck pain.  Skin: Negative for color change, rash and wound.  Neurological: Negative for dizziness, syncope, weakness, light-headedness and headaches.  Hematological: Negative for adenopathy.    Allergies  Cymbalta [duloxetine hcl]; Klonopin [clonazepam]; Codeine; and Latex  Home Medications   Prior to Admission medications   Medication Sig Start Date End Date Taking? Authorizing Provider  albuterol (PROVENTIL HFA;VENTOLIN HFA) 108 (90 BASE) MCG/ACT inhaler Inhale 2 puffs into the lungs every 4 (four) hours as needed for wheezing or shortness of breath. 11/30/13   Janne Napoleon, NP  albuterol (  PROVENTIL) (2.5 MG/3ML) 0.083% nebulizer solution Take 2.5 mg by nebulization every 6 (six) hours as needed for wheezing or shortness of breath.    Historical Provider, MD  amLODipine (NORVASC) 10 MG tablet Take 10 mg by mouth daily.      Historical Provider, MD  aspirin EC 81 MG tablet Take 81 mg by mouth daily.    Historical Provider, MD  Fluticasone-Salmeterol (ADVAIR) 250-50 MCG/DOSE AEPB Inhale 1 puff into the lungs 2 (two) times daily.    Historical Provider, MD  gabapentin (NEURONTIN) 300 MG capsule  Take 1 capsule (300 mg total) by mouth 3 (three) times daily. 10/25/15   Konrad Felix, PA  hydrOXYzine (VISTARIL) 25 MG capsule Take 25 mg by mouth 2 (two) times daily as needed for anxiety.  12/15/13   Historical Provider, MD  montelukast (SINGULAIR) 10 MG tablet Take 10 mg by mouth every morning.    Historical Provider, MD  predniSONE (DELTASONE) 20 MG tablet Take 2 tablets (40 mg total) by mouth daily. 04/07/16 04/12/16  Katy Apo, NP  rosuvastatin (CRESTOR) 10 MG tablet Take 10 mg by mouth daily.      Historical Provider, MD   Meds Ordered and Administered this Visit   Medications  ipratropium-albuterol (DUONEB) 0.5-2.5 (3) MG/3ML nebulizer solution 3 mL (3 mLs Nebulization Given 04/07/16 1326)  methylPREDNISolone sodium succinate (SOLU-MEDROL) 125 mg/2 mL injection 80 mg (80 mg Intramuscular Given 04/07/16 1411)    BP (!) 141/92   Pulse 86   Temp 98.3 F (36.8 C) (Oral)   Resp 16   Ht 5\' 9"  (1.753 m)   Wt 232 lb (105.2 kg)   LMP 03/29/2016   SpO2 98%   BMI 34.26 kg/m  No data found.   Physical Exam  Constitutional: She is oriented to person, place, and time. She appears well-developed and well-nourished. She does not appear ill. No distress.  HENT:  Head: Normocephalic and atraumatic.  Right Ear: Hearing, tympanic membrane, external ear and ear canal normal.  Left Ear: Hearing, tympanic membrane, external ear and ear canal normal.  Nose: Nose normal.  Mouth/Throat: Uvula is midline, oropharynx is clear and moist and mucous membranes are normal.  Neck: Normal range of motion. Neck supple.  Cardiovascular: Normal rate, regular rhythm and normal heart sounds.   No murmur heard. Pulmonary/Chest: Effort normal. No accessory muscle usage. No tachypnea. No respiratory distress. She has no decreased breath sounds. She has wheezes in the right upper field, the right middle field, the right lower field, the left upper field and the left lower field. She has no rhonchi. She has no  rales.  Lymphadenopathy:    She has no cervical adenopathy.  Neurological: She is alert and oriented to person, place, and time.  Skin: Skin is warm and dry. Capillary refill takes less than 2 seconds.  Psychiatric: She has a normal mood and affect. Her behavior is normal. Judgment and thought content normal.    Urgent Care Course     Procedures (including critical care time)  Labs Review Labs Reviewed - No data to display  Imaging Review No results found.   Visual Acuity Review  Right Eye Distance:   Left Eye Distance:   Bilateral Distance:    Right Eye Near:   Left Eye Near:    Bilateral Near:         MDM   1. Moderate persistent asthma with exacerbation   2. Cough    Discussed various treatment options with patient. Recommend  trial Duoneb treatment which was given today. Patient indicated that she felt better after the treatment and on exam there were less wheezes present in all lung fields. Also gave DepoMedrol 80mg  IM today. May start Prednisone 40mg  tomorrow- daily for 5 days. Encouraged to continue Albuterol nebulizer or inhaler every 6 hours as needed. Continue Advair and Singulair. Follow-up with her primary care provider in 2 to 3 days if not resolving or go to ER if symptoms persist or worsen.      Katy Apo, NP 04/07/16 1725

## 2016-04-07 NOTE — Discharge Instructions (Signed)
You were given a shot of DepoMedrol (steroid) today to help with your breathing. Also recommend start Prednisone 40mg  daily for 5 days. Continue to use Albuterol nebulizer and inhalers every 6 hours as needed. Continue Advair and Singulair. Follow-up with your primary care provider in 3 to 4 days if not resolving or go to ER if symptoms worsen.

## 2016-06-20 ENCOUNTER — Ambulatory Visit (HOSPITAL_COMMUNITY)
Admission: EM | Admit: 2016-06-20 | Discharge: 2016-06-20 | Disposition: A | Discharge status: Home / Self Care | Payer: Medicare Other | Attending: Internal Medicine | Admitting: Internal Medicine

## 2016-06-20 ENCOUNTER — Encounter (HOSPITAL_COMMUNITY): Payer: Self-pay | Admitting: Emergency Medicine

## 2016-06-20 DIAGNOSIS — R062 Wheezing: Secondary | ICD-10-CM | POA: Diagnosis not present

## 2016-06-20 DIAGNOSIS — J4541 Moderate persistent asthma with (acute) exacerbation: Secondary | ICD-10-CM | POA: Diagnosis not present

## 2016-06-20 DIAGNOSIS — R0602 Shortness of breath: Secondary | ICD-10-CM

## 2016-06-20 DIAGNOSIS — M778 Other enthesopathies, not elsewhere classified: Secondary | ICD-10-CM

## 2016-06-20 DIAGNOSIS — M79642 Pain in left hand: Secondary | ICD-10-CM

## 2016-06-20 DIAGNOSIS — M779 Enthesopathy, unspecified: Secondary | ICD-10-CM

## 2016-06-20 MED ORDER — IPRATROPIUM-ALBUTEROL 0.5-2.5 (3) MG/3ML IN SOLN
RESPIRATORY_TRACT | Status: AC
  Filled 2016-06-20: qty 3

## 2016-06-20 MED ORDER — PREDNISONE 50 MG PO TABS: ORAL_TABLET | ORAL | 0 refills | Status: DC

## 2016-06-20 MED ORDER — ALBUTEROL SULFATE (2.5 MG/3ML) 0.083% IN NEBU
INHALATION_SOLUTION | RESPIRATORY_TRACT | Status: AC
  Filled 2016-06-20: qty 3

## 2016-06-20 MED ORDER — DEXAMETHASONE SODIUM PHOSPHATE 10 MG/ML IJ SOLN
10.0000 mg | Freq: Once | INTRAMUSCULAR | Status: AC
  Administered 2016-06-20: 10 mg via INTRAMUSCULAR

## 2016-06-20 MED ORDER — IPRATROPIUM-ALBUTEROL 0.5-2.5 (3) MG/3ML IN SOLN
3.0000 mL | Freq: Once | RESPIRATORY_TRACT | Status: AC
  Administered 2016-06-20: 3 mL via RESPIRATORY_TRACT

## 2016-06-20 MED ORDER — DEXAMETHASONE SODIUM PHOSPHATE 10 MG/ML IJ SOLN
INTRAMUSCULAR | Status: AC
  Filled 2016-06-20: qty 1

## 2016-06-20 MED ORDER — ALBUTEROL SULFATE (2.5 MG/3ML) 0.083% IN NEBU
2.5000 mg | INHALATION_SOLUTION | Freq: Once | RESPIRATORY_TRACT | Status: AC
  Administered 2016-06-20: 2.5 mg via RESPIRATORY_TRACT

## 2016-06-20 MED ORDER — METHYLPREDNISOLONE ACETATE 40 MG/ML IJ SUSP
40.0000 mg | Freq: Once | INTRAMUSCULAR | Status: AC
  Administered 2016-06-20: 40 mg via INTRAMUSCULAR

## 2016-06-20 MED ORDER — METHYLPREDNISOLONE ACETATE 40 MG/ML IJ SUSP
INTRAMUSCULAR | Status: AC
  Filled 2016-06-20: qty 1

## 2016-06-20 NOTE — Discharge Instructions (Signed)
Wear the wrist and hand brace most of the time and especially while driving or at work. Limit the use of the fingers as this is the primary source for your hand swelling and pain. Apply ice before and after work. Use your albuterol nebulizer every 4 hours as needed. Take the prednisone daily as directed, start tomorrow. If you continue to have problems with her asthma follow-up with her primary care doctor as there may be additional medications that will help.

## 2016-06-20 NOTE — ED Provider Notes (Signed)
CSN: [id number]     Arrival date & time 06/20/16  31 History   First MD Initiated Contact with Patient 06/20/16 1236     Chief Complaint  Patient presents with  . Asthma  . Hand Pain   (Consider location/radiation/quality/duration/timing/severity/associated sxs/prior Treatment) 56 year old female with 2 separate complaints presenting to the urgent care. First concern is that of asthma flareup. She has been using her nebulizer more frequently recently. The last time she used it was very early this morning prior to 6 AM. She states they are not helping. She states that the spring and summertime tends to exacerbate her asthma more so than other times of the year.   Second complaint is that of chronic left hand swelling for several months. 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 This exacerbates the pain. She was seen at another urgent care, had x-rays of the left hand which were negative according to her including no evidence of foreign arthritis or arthropathy. She was given a thumb spica splint. This plan is noticed to cover her wrist and thumb but does not limit movement of the wrist and digits or hand. Denies any known trauma or injury to the hand.  Was dx'd with repeatative injury, increase with grasping the steering wheel of a dump truck.      Past Medical History:  Diagnosis Date  . Anemia, unspecified 03/17/2011  . Anxiety   . Aortic regurgitation 03/17/2011   Pt. denies problem with her heart.  . Asthma   . Blood clot in vein   . Complex regional pain syndrome   . CVA (cerebral infarction)   . Depression 03/17/2011  . Diverticulitis   . Diverticulosis   . Hyperlipidemia 03/17/2011  . Hypertension   . Mitral valve regurgitation 03/17/2011   Pt. said she never had problem with her heart  . Peripheral neuropathy 03/17/2011   Pt. denies this problem  . Polysubstance abuse 03/17/2011   positive cocaine 08/07/10 er visit; States she has never had  problems with drugs.  . Renal insufficiency syndrome   . Unspecified asthma(493.90) 03/17/2011   Past Surgical History:  Procedure Laterality Date  . CHOLECYSTECTOMY    . KNEE ARTHROSCOPY Right 1992  . ORTHOPEDIC SURGERY    . thumb surgery Right 2003   R thumb reconstruction   Family History  Problem Relation Age of Onset  . Diabetes Mother   . Hypertension Mother   . Prostate cancer Father   . Hypertension Father   . Diabetes Father   . Breast cancer Paternal Aunt        x 2  . Breast cancer Maternal Aunt   . Breast cancer Maternal Aunt    Social History  Substance Use Topics  . Smoking status: Former Smoker    Types: Cigars  . Smokeless tobacco: Never Used  . Alcohol use Yes     Comment: occasional beer   OB History    No data available     Review of Systems  Constitutional: Positive for activity change. Negative for fever.  HENT: Positive for congestion, postnasal drip and rhinorrhea.   Respiratory: Positive for cough, shortness of breath and wheezing.   Musculoskeletal:       Left hand pain as per history of present illness  Skin: Negative.   Neurological: Negative.   All other systems reviewed and are negative.   Allergies  Cymbalta [duloxetine hcl]; Klonopin [clonazepam]; Codeine; and Latex  Home Medications  Prior to Admission medications   Medication Sig Start Date End Date Taking? Authorizing Provider  albuterol (PROVENTIL HFA;VENTOLIN HFA) 108 (90 BASE) MCG/ACT inhaler Inhale 2 puffs into the lungs every 4 (four) hours as needed for wheezing or shortness of breath. 11/30/13   Janne Napoleon, NP  albuterol (PROVENTIL) (2.5 MG/3ML) 0.083% nebulizer solution Take 2.5 mg by nebulization every 6 (six) hours as needed for wheezing or shortness of breath.    [provider]  amLODipine (NORVASC) 10 MG tablet Take 10 mg by mouth daily.      [provider]  aspirin EC 81 MG tablet Take 81 mg by mouth daily.    [provider]   Fluticasone-Salmeterol (ADVAIR) 250-50 MCG/DOSE AEPB Inhale 1 puff into the lungs 2 (two) times daily.    [provider]  gabapentin (NEURONTIN) 300 MG capsule Take 1 capsule (300 mg total) by mouth 3 (three) times daily. 10/25/15   Konrad Felix, PA  hydrOXYzine (VISTARIL) 25 MG capsule Take 25 mg by mouth 2 (two) times daily as needed for anxiety.  12/15/13   [provider]  montelukast (SINGULAIR) 10 MG tablet Take 10 mg by mouth every morning.    [provider]  predniSONE (DELTASONE) 50 MG tablet 1 tab po daily for 6 days. Take with food. Start 06/21/16. 06/20/16   Janne Napoleon, NP  rosuvastatin (CRESTOR) 10 MG tablet Take 10 mg by mouth daily.      [provider]   Meds Ordered and Administered this Visit   Medications  ipratropium-albuterol (DUONEB) 0.5-2.5 (3) MG/3ML nebulizer solution 3 mL (3 mLs Nebulization Given 06/20/16 1255)  albuterol (PROVENTIL) (2.5 MG/3ML) 0.083% nebulizer solution 2.5 mg (2.5 mg Nebulization Given 06/20/16 1255)  dexamethasone (DECADRON) injection 10 mg (10 mg Intramuscular Given 06/20/16 1255)  methylPREDNISolone acetate (DEPO-MEDROL) injection 40 mg (40 mg Intramuscular Given 06/20/16 1255)    BP 130/76 (BP Location: Right Arm)   Pulse 82   Temp 97.3 F (36.3 C) (Oral)   Resp 20   SpO2 97%  No data found.   Physical Exam  Constitutional: She is oriented to person, place, and time. She appears well-developed and well-nourished. No distress.  HENT:  Mouth/Throat: Oropharynx is clear and moist. No oropharyngeal exudate.  Eyes: EOM are normal. Right eye exhibits no discharge. Left eye exhibits no discharge.  Neck: Normal range of motion. Neck supple.  Cardiovascular: Normal rate, regular rhythm, normal heart sounds and intact distal pulses.   Pulmonary/Chest: Effort normal. No respiratory distress. She has wheezes.  Bilateral coarseness and wheezing. Diminished breath sounds, prolonged expiratory phase. Decreased chest  expansion.  Musculoskeletal: Normal range of motion. She exhibits no edema.  Left hand with swelling across the dorsal aspect near the MCPs. Tenderness to the palmar aspect just proximal to the digits. Palpation following the tendons produces tenderness.  Lymphadenopathy:    She has no cervical adenopathy.  Neurological: She is alert and oriented to person, place, and time. No cranial nerve deficit.  Skin: Skin is warm and dry.  Psychiatric: She has a normal mood and affect.  Nursing note and vitals reviewed.   Urgent Care Course     Procedures (including critical care time)  Labs Review Labs Reviewed - No data to display  Imaging Review No results found.   Visual Acuity Review  Right Eye Distance:   Left Eye Distance:   Bilateral Distance:    Right Eye Near:   Left Eye Near:    Bilateral  Near:         MDM   1. Moderate persistent asthma with exacerbation   2. Left hand pain   3. Left hand tendonitis   Post DuoNeb the patient states she is breathing much better. Auscultation reveals substantial improvement with air movement and rare wheeze. Wear the wrist and hand brace most of the time and especially while driving or at work. Limit the use of the fingers as this is the primary source for your hand swelling and pain. Apply ice before and after work. Use your albuterol nebulizer every 4 hours as needed. Take the prednisone daily as directed, start tomorrow. If you continue to have problems with her asthma follow-up with her primary care doctor as there may be additional medications that will help. Meds ordered this encounter  Medications  . ipratropium-albuterol (DUONEB) 0.5-2.5 (3) MG/3ML nebulizer solution 3 mL  . albuterol (PROVENTIL) (2.5 MG/3ML) 0.083% nebulizer solution 2.5 mg  . dexamethasone (DECADRON) injection 10 mg  . methylPREDNISolone acetate (DEPO-MEDROL) injection 40 mg  . predniSONE (DELTASONE) 50 MG tablet    Sig: 1 tab po daily for 6 days. Take  with food. Start 06/21/16.    Dispense:  6 tablet    Refill:  0    Order Specific Question:   Supervising Provider    Answer:   Sherlene Shams [[id number]]       Janne Napoleon, NP 06/20/16 1341

## 2016-06-20 NOTE — ED Triage Notes (Signed)
The patient presented to the [facility] with a complaint of recurrent hand pain to her left hand that was diagnosed as a repetitive motion injury by another [facility]. The patient presented wearing a thumb spica wrist splint. The patient also complained of asthma symptoms.

## 2016-09-19 ENCOUNTER — Encounter (HOSPITAL_COMMUNITY): Payer: Self-pay | Admitting: Emergency Medicine

## 2016-09-19 ENCOUNTER — Emergency Department (HOSPITAL_COMMUNITY)
Admission: EM | Admit: 2016-09-19 | Discharge: 2016-09-19 | Disposition: A | Discharge status: Home / Self Care | Payer: Medicare Other | Attending: Emergency Medicine | Admitting: Emergency Medicine

## 2016-09-19 DIAGNOSIS — R110 Nausea: Secondary | ICD-10-CM | POA: Diagnosis not present

## 2016-09-19 DIAGNOSIS — R1011 Right upper quadrant pain: Secondary | ICD-10-CM | POA: Diagnosis present

## 2016-09-19 DIAGNOSIS — Z5321 Procedure and treatment not carried out due to patient leaving prior to being seen by health care provider: Secondary | ICD-10-CM | POA: Insufficient documentation

## 2016-09-19 LAB — COMPREHENSIVE METABOLIC PANEL
ALT: 16 U/L (ref 14–54)
AST: 25 U/L (ref 15–41)
Albumin: 3.8 g/dL (ref 3.5–5.0)
Alkaline Phosphatase: 69 U/L (ref 38–126)
Anion gap: 7 (ref 5–15)
BUN: 17 mg/dL (ref 6–20)
CO2: 23 mmol/L (ref 22–32)
Calcium: 9.6 mg/dL (ref 8.9–10.3)
Chloride: 110 mmol/L (ref 101–111)
Creatinine, Ser: 1.38 mg/dL — ABNORMAL HIGH (ref 0.44–1.00)
GFR calc Af Amer: 49 mL/min — ABNORMAL LOW (ref >60)
GFR calc non Af Amer: 42 mL/min — ABNORMAL LOW (ref >60)
Glucose, Bld: 94 mg/dL (ref 65–99)
Potassium: 4.2 mmol/L (ref 3.5–5.1)
Sodium: 140 mmol/L (ref 135–145)
Total Bilirubin: 0.6 mg/dL (ref 0.3–1.2)
Total Protein: 6.9 g/dL (ref 6.5–8.1)

## 2016-09-19 LAB — URINALYSIS, ROUTINE W REFLEX MICROSCOPIC
Bacteria, UA: NONE SEEN
Bilirubin Urine: NEGATIVE
Glucose, UA: NEGATIVE mg/dL
Hgb urine dipstick: NEGATIVE
Ketones, ur: NEGATIVE mg/dL
Nitrite: NEGATIVE
Protein, ur: 30 mg/dL — AB
Specific Gravity, Urine: 1.024 (ref 1.005–1.030)
pH: 5 (ref 5.0–8.0)

## 2016-09-19 LAB — CBC
HCT: 38.7 % (ref 36.0–46.0)
Hemoglobin: 12.1 g/dL (ref 12.0–15.0)
MCH: 25.8 pg — ABNORMAL LOW (ref 26.0–34.0)
MCHC: 31.3 g/dL (ref 30.0–36.0)
MCV: 82.5 fL (ref 78.0–100.0)
Platelets: 351 10*3/uL (ref 150–400)
RBC: 4.69 MIL/uL (ref 3.87–5.11)
RDW: 16 % — ABNORMAL HIGH (ref 11.5–15.5)
WBC: 7.5 10*3/uL (ref 4.0–10.5)

## 2016-09-19 LAB — LIPASE, BLOOD: Lipase: 31 U/L (ref 11–51)

## 2016-09-19 NOTE — ED Triage Notes (Signed)
Pt wishes to leave at this time. RN advised against it and notified of the risks. Pt continues to state she needs to leave.

## 2016-09-19 NOTE — ED Triage Notes (Signed)
Pt to ER for eval of RUQ abd pain, states started one month ago but has gotten worse today. States nausea without vomiting. States worse after eating. A/o x4.

## 2016-10-18 ENCOUNTER — Emergency Department (HOSPITAL_COMMUNITY): Payer: Medicare Other

## 2016-10-18 ENCOUNTER — Encounter (HOSPITAL_COMMUNITY): Payer: Self-pay | Admitting: *Deleted

## 2016-10-18 ENCOUNTER — Emergency Department (HOSPITAL_COMMUNITY)
Admission: EM | Admit: 2016-10-18 | Discharge: 2016-10-18 | Disposition: A | Discharge status: Home / Self Care | Payer: Medicare Other | Attending: Emergency Medicine | Admitting: Emergency Medicine

## 2016-10-18 DIAGNOSIS — Z7982 Long term (current) use of aspirin: Secondary | ICD-10-CM | POA: Insufficient documentation

## 2016-10-18 DIAGNOSIS — R0981 Nasal congestion: Secondary | ICD-10-CM | POA: Insufficient documentation

## 2016-10-18 DIAGNOSIS — J4521 Mild intermittent asthma with (acute) exacerbation: Secondary | ICD-10-CM | POA: Diagnosis not present

## 2016-10-18 DIAGNOSIS — R0989 Other specified symptoms and signs involving the circulatory and respiratory systems: Secondary | ICD-10-CM | POA: Diagnosis not present

## 2016-10-18 DIAGNOSIS — Z87891 Personal history of nicotine dependence: Secondary | ICD-10-CM | POA: Insufficient documentation

## 2016-10-18 DIAGNOSIS — I10 Essential (primary) hypertension: Secondary | ICD-10-CM | POA: Insufficient documentation

## 2016-10-18 DIAGNOSIS — Z91040 Latex allergy status: Secondary | ICD-10-CM | POA: Insufficient documentation

## 2016-10-18 DIAGNOSIS — Z79899 Other long term (current) drug therapy: Secondary | ICD-10-CM | POA: Diagnosis not present

## 2016-10-18 DIAGNOSIS — R0602 Shortness of breath: Secondary | ICD-10-CM | POA: Diagnosis present

## 2016-10-18 LAB — CBC WITH DIFFERENTIAL/PLATELET
Basophils Absolute: 0.1 10*3/uL (ref 0.0–0.1)
Basophils Relative: 1 %
Eosinophils Absolute: 0.6 10*3/uL (ref 0.0–0.7)
Eosinophils Relative: 7 %
HCT: 41.3 % (ref 36.0–46.0)
Hemoglobin: 13 g/dL (ref 12.0–15.0)
Lymphocytes Relative: 42 %
Lymphs Abs: 3.3 10*3/uL (ref 0.7–4.0)
MCH: 26.1 pg (ref 26.0–34.0)
MCHC: 31.5 g/dL (ref 30.0–36.0)
MCV: 82.9 fL (ref 78.0–100.0)
Monocytes Absolute: 0.7 10*3/uL (ref 0.1–1.0)
Monocytes Relative: 9 %
Neutro Abs: 3.2 10*3/uL (ref 1.7–7.7)
Neutrophils Relative %: 41 %
Platelets: 382 10*3/uL (ref 150–400)
RBC: 4.98 MIL/uL (ref 3.87–5.11)
RDW: 15.5 % (ref 11.5–15.5)
WBC: 7.8 10*3/uL (ref 4.0–10.5)

## 2016-10-18 LAB — LIPASE, BLOOD: Lipase: 25 U/L (ref 11–51)

## 2016-10-18 LAB — COMPREHENSIVE METABOLIC PANEL
ALT: 18 U/L (ref 14–54)
AST: 32 U/L (ref 15–41)
Albumin: 3.8 g/dL (ref 3.5–5.0)
Alkaline Phosphatase: 70 U/L (ref 38–126)
Anion gap: 9 (ref 5–15)
BUN: 11 mg/dL (ref 6–20)
CO2: 24 mmol/L (ref 22–32)
Calcium: 9 mg/dL (ref 8.9–10.3)
Chloride: 105 mmol/L (ref 101–111)
Creatinine, Ser: 1.29 mg/dL — ABNORMAL HIGH (ref 0.44–1.00)
GFR calc Af Amer: 53 mL/min — ABNORMAL LOW (ref >60)
GFR calc non Af Amer: 46 mL/min — ABNORMAL LOW (ref >60)
Glucose, Bld: 89 mg/dL (ref 65–99)
Potassium: 4.5 mmol/L (ref 3.5–5.1)
Sodium: 138 mmol/L (ref 135–145)
Total Bilirubin: 0.8 mg/dL (ref 0.3–1.2)
Total Protein: 7.3 g/dL (ref 6.5–8.1)

## 2016-10-18 LAB — I-STAT TROPONIN, ED: Troponin i, poc: 0 ng/mL (ref 0.00–0.08)

## 2016-10-18 MED ORDER — ALBUTEROL SULFATE (2.5 MG/3ML) 0.083% IN NEBU
5.0000 mg | INHALATION_SOLUTION | Freq: Once | RESPIRATORY_TRACT | Status: AC
  Administered 2016-10-18: 5 mg via RESPIRATORY_TRACT
  Filled 2016-10-18: qty 6

## 2016-10-18 MED ORDER — ALBUTEROL SULFATE (2.5 MG/3ML) 0.083% IN NEBU
INHALATION_SOLUTION | RESPIRATORY_TRACT | Status: DC
Start: 2016-10-18 — End: 2016-10-18
  Filled 2016-10-18: qty 6

## 2016-10-18 MED ORDER — PREDNISONE 20 MG PO TABS: ORAL_TABLET | ORAL | 0 refills | Status: DC

## 2016-10-18 MED ORDER — METHYLPREDNISOLONE SODIUM SUCC 125 MG IJ SOLR
125.0000 mg | Freq: Once | INTRAMUSCULAR | Status: AC
  Administered 2016-10-18: 125 mg via INTRAVENOUS
  Filled 2016-10-18: qty 2

## 2016-10-18 MED ORDER — SODIUM CHLORIDE 0.9 % IV BOLUS (SEPSIS)
1000.0000 mL | Freq: Once | INTRAVENOUS | Status: AC
  Administered 2016-10-18: 1000 mL via INTRAVENOUS

## 2016-10-18 MED ORDER — ALBUTEROL SULFATE (2.5 MG/3ML) 0.083% IN NEBU
5.0000 mg | INHALATION_SOLUTION | Freq: Once | RESPIRATORY_TRACT | Status: AC
  Administered 2016-10-18: 5 mg via RESPIRATORY_TRACT

## 2016-10-18 MED ORDER — BENZONATATE 100 MG PO CAPS: 100.0000 mg | ORAL_CAPSULE | Freq: Three times a day (TID) | ORAL | 0 refills | Status: DC

## 2016-10-18 MED ORDER — IPRATROPIUM BROMIDE 0.02 % IN SOLN
0.5000 mg | Freq: Once | RESPIRATORY_TRACT | Status: AC
  Administered 2016-10-18: 0.5 mg via RESPIRATORY_TRACT
  Filled 2016-10-18: qty 2.5

## 2016-10-18 NOTE — ED Triage Notes (Signed)
To ED for eval of increased sob and wheezing for past 2 days. Using at home breathing tx without relief. Pt is able to speak in full sentences but audible wheezing noted. Skin w/d. Denies fevers. Complains of productive cough

## 2016-10-18 NOTE — ED Notes (Signed)
Pt requesting something for her cough, as in a d/c rx. Dr. Darl Householder will order something.

## 2016-10-18 NOTE — Discharge Instructions (Signed)
Take prednisone as prescribed.   Use your albuterol every 4 hrs as needed for cough or wheezing   See your doctor  Return to ER if you have worse wheezing, cough, fever

## 2016-10-18 NOTE — ED Provider Notes (Signed)
[facility] DEPT Provider Note   CSN: [id number] Arrival date & time: 10/18/16  9735     History   Chief Complaint Chief Complaint  Patient presents with  . Shortness of Breath    HPI Judith Mccullough is a 56 y.o. female history of asthma, stroke, depression here presenting with shortness of breath, wheezing. Patient states that she had sinus congestion and runny nose for the last several days with the weather change. For the last 2-3 days she has worsening shortness of breath and wheezing. She has been using her albuterol multiple times with no relief. She has a productive cough with whitish sputum but denies any greenish or yellowish sputum. Has some chills several days ago that resolved. Patient came to the ED about a month ago for abdominal pain but left without being seen. She states that several days ago she's been nauseated denies any vomiting currently and denies any abdominal pain. She has several asthma exacerbations a year and usually requires steroids. Denies recent admissions.   The history is provided by the patient.    Past Medical History:  Diagnosis Date  . Anemia, unspecified 03/17/2011  . Anxiety   . Aortic regurgitation 03/17/2011   Pt. denies problem with her heart.  . Asthma   . Blood clot in vein   . Complex regional pain syndrome   . CVA (cerebral infarction)   . Depression 03/17/2011  . Diverticulitis   . Diverticulosis   . Hyperlipidemia 03/17/2011  . Hypertension   . Mitral valve regurgitation 03/17/2011   Pt. said she never had problem with her heart  . Peripheral neuropathy 03/17/2011   Pt. denies this problem  . Polysubstance abuse ([place]) 03/17/2011   positive cocaine 08/07/10 er visit; States she has never had problems with drugs.  . Renal insufficiency syndrome   . Unspecified asthma(493.90) 03/17/2011    Patient Active Problem List   Diagnosis Date Noted  . CVA (cerebral infarction) 03/17/2011  . Unspecified asthma(493.90) 03/17/2011  .  Preventative health care 03/17/2011  . Polysubstance abuse ([place]) 03/17/2011  . Hyperlipidemia 03/17/2011  . Anemia, unspecified 03/17/2011  . Aortic regurgitation 03/17/2011  . Mitral valve regurgitation 03/17/2011  . Depression 03/17/2011  . Peripheral neuropathy 03/17/2011  . Hypertension   . Anxiety     Past Surgical History:  Procedure Laterality Date  . CHOLECYSTECTOMY    . KNEE ARTHROSCOPY Right 1992  . ORTHOPEDIC SURGERY    . thumb surgery Right 2003   R thumb reconstruction    OB History    No data available       Home Medications    Prior to Admission medications   Medication Sig Start Date End Date Taking? Authorizing Provider  albuterol (PROVENTIL HFA;VENTOLIN HFA) 108 (90 BASE) MCG/ACT inhaler Inhale 2 puffs into the lungs every 4 (four) hours as needed for wheezing or shortness of breath. 11/30/13   Janne Napoleon, NP  albuterol (PROVENTIL) (2.5 MG/3ML) 0.083% nebulizer solution Take 2.5 mg by nebulization every 6 (six) hours as needed for wheezing or shortness of breath.    [provider]  amLODipine (NORVASC) 10 MG tablet Take 10 mg by mouth daily.      [provider]  aspirin EC 81 MG tablet Take 81 mg by mouth daily.    [provider]  Fluticasone-Salmeterol (ADVAIR) 250-50 MCG/DOSE AEPB Inhale 1 puff into the lungs 2 (two) times daily.    [provider]  gabapentin (NEURONTIN) 300 MG capsule Take 1 capsule (  300 mg total) by mouth 3 (three) times daily. 10/25/15   Konrad Felix, PA  hydrOXYzine (VISTARIL) 25 MG capsule Take 25 mg by mouth 2 (two) times daily as needed for anxiety.  12/15/13   [provider]  montelukast (SINGULAIR) 10 MG tablet Take 10 mg by mouth every morning.    [provider]  predniSONE (DELTASONE) 50 MG tablet 1 tab po daily for 6 days. Take with food. Start 06/21/16. 06/20/16   Janne Napoleon, NP  rosuvastatin (CRESTOR) 10 MG tablet Take 10 mg by mouth daily.      [provider]    Family History Family History  Problem Relation Age of Onset  . Diabetes Mother   . Hypertension Mother   . Prostate cancer Father   . Hypertension Father   . Diabetes Father   . Breast cancer Paternal Aunt        x 2  . Breast cancer Maternal Aunt   . Breast cancer Maternal Aunt     Social History Social History  Substance Use Topics  . Smoking status: Former Smoker    Types: Cigars  . Smokeless tobacco: Never Used  . Alcohol use Yes     Comment: occasional beer     Allergies   Cymbalta [duloxetine hcl]; Klonopin [clonazepam]; Codeine; and Latex   Review of Systems Review of Systems  Respiratory: Positive for shortness of breath.   All other systems reviewed and are negative.    Physical Exam Updated Vital Signs BP (!) 143/95   Pulse 94   Temp 97.8 F (36.6 C) (Oral)   Resp (!) 37   SpO2 100%   Physical Exam  Constitutional: She is oriented to person, place, and time. She appears well-developed.  Slightly tachypneic   HENT:  Head: Normocephalic.  Mouth/Throat: Oropharynx is clear and moist.  Eyes: Pupils are equal, round, and reactive to light. Conjunctivae and EOM are normal.  Neck: Normal range of motion. Neck supple.  Cardiovascular: Normal rate, regular rhythm and normal heart sounds.   Pulmonary/Chest:  Slightly tachypneic, + mild diffuse wheezing, no retractions   Abdominal: Soft. Bowel sounds are normal. She exhibits no distension. There is no tenderness. There is no guarding.  Musculoskeletal: Normal range of motion. She exhibits no edema, tenderness or deformity.  Neurological: She is alert and oriented to person, place, and time.  Skin: Skin is warm.  Psychiatric: She has a normal mood and affect.  Nursing note and vitals reviewed.    ED Treatments / Results  Labs (all labs ordered are listed, but only abnormal results are displayed) Labs Reviewed  COMPREHENSIVE METABOLIC PANEL - Abnormal; Notable for the following:        Result Value   Creatinine, Ser 1.29 (*)    GFR calc non Af Amer 46 (*)    GFR calc Af Amer 53 (*)    All other components within normal limits  CBC WITH DIFFERENTIAL/PLATELET  LIPASE, BLOOD  I-STAT TROPONIN, ED    EKG  EKG Interpretation  Date/Time:  Wednesday October 18 2016 09:40:52 EDT Ventricular Rate:  86 PR Interval:    QRS Duration: 79 QT Interval:  392 QTC Calculation: 469 R Axis:   81 Text Interpretation:  Sinus rhythm Baseline wander in lead(s) V1 No significant change since last tracing Confirmed by Wandra Arthurs [phone]) on 10/18/2016 10:11:54 AM       Radiology Dg Chest 2 View  Result Date: 10/18/2016 CLINICAL DATA:  Shortness of  breath, asthma EXAM: CHEST  2 VIEW COMPARISON:  CT chest 08/03/2014 FINDINGS: The lungs are hyperinflated likely secondary to COPD. There is no focal parenchymal opacity. There is no pleural effusion or pneumothorax. The heart and mediastinal contours are unremarkable. The osseous structures are unremarkable. IMPRESSION: No active cardiopulmonary disease. Emphysema. (TOI71-I45.9) Electronically Signed   By: Kathreen Devoid   On: 10/18/2016 09:24    Procedures Procedures (including critical care time)  Medications Ordered in ED Medications  albuterol (PROVENTIL) (2.5 MG/3ML) 0.083% nebulizer solution (not administered)  albuterol (PROVENTIL) (2.5 MG/3ML) 0.083% nebulizer solution 5 mg (5 mg Nebulization Given 10/18/16 0847)  sodium chloride 0.9 % bolus 1,000 mL (0 mLs Intravenous Stopped 10/18/16 1037)  methylPREDNISolone sodium succinate (SOLU-MEDROL) 125 mg/2 mL injection 125 mg (125 mg Intravenous Given 10/18/16 1038)  albuterol (PROVENTIL) (2.5 MG/3ML) 0.083% nebulizer solution 5 mg (5 mg Nebulization Given 10/18/16 1039)  ipratropium (ATROVENT) nebulizer solution 0.5 mg (0.5 mg Nebulization Given 10/18/16 1038)     Initial Impression / Assessment and Plan / ED Course  I have reviewed the triage vital signs and the nursing  notes.  Pertinent labs & imaging results that were available during my care of the patient were reviewed by me and considered in my medical decision making (see chart for details).     Judith Mccullough is a 56 y.o. female here with SOB, wheezing, cough. Likely asthma exacerbation. Will check labs, CXR. Will give albuterol, steroids and reassess.   11:12 AM CXR clear. Labs unremarkable. Felt better with steroids and albuterol. Minimal wheezing now. Will dc home with prednisone, has albuterol at home    Final Clinical Impressions(s) / ED Diagnoses   Final diagnoses:  None    New Prescriptions New Prescriptions   No medications on file     Drenda Freeze, MD 10/18/16 1116

## 2016-12-04 ENCOUNTER — Ambulatory Visit: Payer: Self-pay | Admitting: Family Medicine

## 2016-12-08 ENCOUNTER — Other Ambulatory Visit: Payer: Self-pay

## 2016-12-08 ENCOUNTER — Encounter (HOSPITAL_COMMUNITY): Payer: Self-pay | Admitting: Emergency Medicine

## 2016-12-08 ENCOUNTER — Emergency Department (HOSPITAL_COMMUNITY)
Admission: EM | Admit: 2016-12-08 | Discharge: 2016-12-08 | Disposition: A | Discharge status: Home / Self Care | Payer: Medicare Other | Attending: Emergency Medicine | Admitting: Emergency Medicine

## 2016-12-08 DIAGNOSIS — Z79899 Other long term (current) drug therapy: Secondary | ICD-10-CM | POA: Insufficient documentation

## 2016-12-08 DIAGNOSIS — Z8673 Personal history of transient ischemic attack (TIA), and cerebral infarction without residual deficits: Secondary | ICD-10-CM | POA: Insufficient documentation

## 2016-12-08 DIAGNOSIS — F329 Major depressive disorder, single episode, unspecified: Secondary | ICD-10-CM | POA: Insufficient documentation

## 2016-12-08 DIAGNOSIS — L02412 Cutaneous abscess of left axilla: Secondary | ICD-10-CM

## 2016-12-08 DIAGNOSIS — J45909 Unspecified asthma, uncomplicated: Secondary | ICD-10-CM | POA: Insufficient documentation

## 2016-12-08 DIAGNOSIS — Z7982 Long term (current) use of aspirin: Secondary | ICD-10-CM | POA: Diagnosis not present

## 2016-12-08 DIAGNOSIS — I10 Essential (primary) hypertension: Secondary | ICD-10-CM | POA: Diagnosis not present

## 2016-12-08 DIAGNOSIS — Z9049 Acquired absence of other specified parts of digestive tract: Secondary | ICD-10-CM | POA: Insufficient documentation

## 2016-12-08 DIAGNOSIS — F419 Anxiety disorder, unspecified: Secondary | ICD-10-CM | POA: Diagnosis not present

## 2016-12-08 DIAGNOSIS — Z91040 Latex allergy status: Secondary | ICD-10-CM | POA: Diagnosis not present

## 2016-12-08 DIAGNOSIS — Z87891 Personal history of nicotine dependence: Secondary | ICD-10-CM | POA: Insufficient documentation

## 2016-12-08 DIAGNOSIS — R2232 Localized swelling, mass and lump, left upper limb: Secondary | ICD-10-CM | POA: Diagnosis present

## 2016-12-08 MED ORDER — CLINDAMYCIN HCL 150 MG PO CAPS: 450.0000 mg | ORAL_CAPSULE | Freq: Three times a day (TID) | ORAL | 0 refills | Status: AC

## 2016-12-08 MED ORDER — LIDOCAINE HCL (PF) 1 % IJ SOLN
5.0000 mL | Freq: Once | INTRAMUSCULAR | Status: AC
  Administered 2016-12-08: 5 mL
  Filled 2016-12-08: qty 5

## 2016-12-08 MED ORDER — OXYCODONE HCL 5 MG PO TABS
5.0000 mg | ORAL_TABLET | Freq: Once | ORAL | Status: AC
  Administered 2016-12-08: 5 mg via ORAL
  Filled 2016-12-08: qty 1

## 2016-12-08 MED ORDER — LIDOCAINE-EPINEPHRINE (PF) 2 %-1:200000 IJ SOLN
10.0000 mL | Freq: Once | INTRAMUSCULAR | Status: AC
  Administered 2016-12-08: 10 mL
  Filled 2016-12-08: qty 20

## 2016-12-08 MED ORDER — HYDROCODONE-ACETAMINOPHEN 5-325 MG PO TABS: 1.0000 | ORAL_TABLET | ORAL | 0 refills | Status: DC | PRN

## 2016-12-08 MED ORDER — OXYCODONE-ACETAMINOPHEN 5-325 MG PO TABS
1.0000 | ORAL_TABLET | ORAL | Status: DC | PRN
  Administered 2016-12-08: 1 via ORAL
  Filled 2016-12-08: qty 1

## 2016-12-08 NOTE — ED Provider Notes (Signed)
[facility] EMERGENCY DEPARTMENT Provider Note   CSN: [id number] Arrival date & time: 12/08/16  0844     History   Chief Complaint Chief Complaint  Judith Mccullough presents with  . Abscess    HPI Judith Mccullough is a 56 y.o. female.  HPI   Judith Mccullough is a 56 year old female with a history of anemia, prediabetes, peripheral neuropathy, and axillary abscess presenting for a recurrent axillary abscess.  Judith Mccullough reports last time Judith Mccullough had this was many years ago.  Judith Mccullough reports that Judith Mccullough noticed that the abscess began developing approximately 4 days ago.  Judith Mccullough has had nausea for the past 2 days, which Judith Mccullough reports occurs when the pain is severe.  Judith Mccullough has also felt chilled.  Judith Mccullough has maintained good p.o. hydration during this event.  Judith Mccullough reports pain is currently a 10 out of 10 and unrelieved by Tylenol.    Past Medical History:  Diagnosis Date  . Anemia, unspecified 03/17/2011  . Anxiety   . Aortic regurgitation 03/17/2011   Pt. denies problem with her heart.  . Asthma   . Blood clot in vein   . Complex regional pain syndrome   . CVA (cerebral infarction)   . Depression 03/17/2011  . Diverticulitis   . Diverticulosis   . Hyperlipidemia 03/17/2011  . Hypertension   . Mitral valve regurgitation 03/17/2011   Pt. said Judith Mccullough never had problem with her heart  . Peripheral neuropathy 03/17/2011   Pt. denies this problem  . Polysubstance abuse ([place]) 03/17/2011   positive cocaine 08/07/10 er visit; States Judith Mccullough has never had problems with drugs.  . Renal insufficiency syndrome   . Unspecified asthma(493.90) 03/17/2011    Judith Mccullough Active Problem List   Diagnosis Date Noted  . CVA (cerebral infarction) 03/17/2011  . Unspecified asthma(493.90) 03/17/2011  . Preventative health care 03/17/2011  . Polysubstance abuse ([place]) 03/17/2011  . Hyperlipidemia 03/17/2011  . Anemia, unspecified 03/17/2011  . Aortic regurgitation 03/17/2011  . Mitral valve regurgitation 03/17/2011  .  Depression 03/17/2011  . Peripheral neuropathy 03/17/2011  . Hypertension   . Anxiety     Past Surgical History:  Procedure Laterality Date  . CHOLECYSTECTOMY    . KNEE ARTHROSCOPY Right 1992  . ORTHOPEDIC SURGERY    . thumb surgery Right 2003   R thumb reconstruction    OB History    No data available       Home Medications    Prior to Admission medications   Medication Sig Start Date End Date Taking? Authorizing Provider  albuterol (PROVENTIL HFA;VENTOLIN HFA) 108 (90 BASE) MCG/ACT inhaler Inhale 2 puffs into the lungs every 4 (four) hours as needed for wheezing or shortness of breath. 11/30/13   Janne Napoleon, NP  albuterol (PROVENTIL) (2.5 MG/3ML) 0.083% nebulizer solution Take 2.5 mg by nebulization every 6 (six) hours as needed for wheezing or shortness of breath.    [provider]  amLODipine (NORVASC) 10 MG tablet Take 10 mg by mouth daily.      [provider]  aspirin EC 81 MG tablet Take 81 mg by mouth daily.    [provider]  benzonatate (TESSALON) 100 MG capsule Take 1 capsule (100 mg total) by mouth every 8 (eight) hours. 10/18/16   Drenda Freeze, MD  clindamycin (CLEOCIN) 150 MG capsule Take 3 capsules (450 mg total) by mouth 3 (three) times daily for 7 days. 12/08/16 12/15/16  Langston Masker B, PA-C  Fluticasone-Salmeterol (ADVAIR) 250-50 MCG/DOSE AEPB Inhale 1 puff  into the lungs 2 (two) times daily.    [provider]  gabapentin (NEURONTIN) 300 MG capsule Take 1 capsule (300 mg total) by mouth 3 (three) times daily. 10/25/15   Konrad Felix, PA  HYDROcodone-acetaminophen (NORCO/VICODIN) 5-325 MG tablet Take 1 tablet by mouth every 4 (four) hours as needed. 12/08/16   Langston Masker B, PA-C  hydrOXYzine (VISTARIL) 25 MG capsule Take 25 mg by mouth 2 (two) times daily as needed for anxiety.  12/15/13   [provider]  montelukast (SINGULAIR) 10 MG tablet Take 10 mg by mouth every morning.    [provider]  predniSONE (DELTASONE) 20 MG tablet Take 60 mg daily x 2 days then 40 mg daily x 2 days then 20 mg daily x 2 days 10/18/16   Drenda Freeze, MD  rosuvastatin (CRESTOR) 10 MG tablet Take 10 mg by mouth daily.      [provider]    Family History Family History  Problem Relation Age of Onset  . Diabetes Mother   . Hypertension Mother   . Prostate cancer Father   . Hypertension Father   . Diabetes Father   . Breast cancer Paternal Aunt        x 2  . Breast cancer Maternal Aunt   . Breast cancer Maternal Aunt     Social History Social History   Tobacco Use  . Smoking status: Former Smoker    Types: Cigars  . Smokeless tobacco: Never Used  Substance Use Topics  . Alcohol use: Yes    Comment: occasional beer  . Drug use: No    Comment: THC & cocaine + @ 08-07-10 ER visit     Allergies   Cymbalta [duloxetine hcl]; Klonopin [clonazepam]; Codeine; and Latex   Review of Systems Review of Systems  Constitutional: Positive for chills.  Gastrointestinal: Positive for nausea.  Skin: Positive for color change and rash.     Physical Exam Updated Vital Signs BP (!) 141/105 (BP Location: Right Wrist)   Pulse (!) 101   Temp 99.7 F (37.6 C) (Oral)   Resp 18   Ht 5' 9.5" (1.765 m)   Wt 104.3 kg (230 lb)   SpO2 96%   BMI 33.48 kg/m   Physical Exam  Constitutional: Judith Mccullough appears well-developed and well-nourished. No distress.  Sitting comfortably in bed.  HENT:  Head: Normocephalic and atraumatic.  Eyes: Conjunctivae are normal. Right eye exhibits no discharge. Left eye exhibits no discharge.  EOMs normal to gross examination.  Neck: Normal range of motion.  Cardiovascular: Normal rate, regular rhythm and normal heart sounds.  No murmur heard. Pulmonary/Chest: Effort normal and breath sounds normal.  Normal respiratory effort. Judith Mccullough converses comfortably. No audible wheeze or stridor.  Abdominal: Judith Mccullough exhibits no distension.  Musculoskeletal: Normal  range of motion.  Neurological: Judith Mccullough is alert.  Cranial nerves intact to gross observation. Judith Mccullough moves extremities without difficulty.  Skin: Skin is warm and dry. Judith Mccullough is not diaphoretic.  Judith Mccullough exhibits induration throughout the left axilla with a focus of fluctuance in the superior region of induration.  There is a mild amount of surrounding erythema. There is an oval-shaped area of induration elevated above the surrounding tissue.  Psychiatric: Judith Mccullough has a normal mood and affect. Her behavior is normal. Judgment and thought content normal.  Nursing note and vitals reviewed.    ED Treatments / Results  Labs (all labs ordered are listed, but only abnormal results are displayed) Labs Reviewed -  No data to display  EKG  EKG Interpretation None       Radiology No results found.  Procedures .Marland KitchenIncision and Drainage Date/Time: 12/08/2016 7:24 PM Performed by: Albesa Seen, PA-C Authorized by: Albesa Seen, PA-C   Consent:    Consent obtained:  Verbal   Consent given by:  Judith Mccullough   Risks discussed:  Bleeding, incomplete drainage and pain Location:    Type:  Abscess   Location: Axilla. Pre-procedure details:    Skin preparation:  Betadine Anesthesia (see MAR for exact dosages):    Anesthesia method:  Local infiltration   Local anesthetic:  Lidocaine 2% WITH epi Procedure type:    Complexity:  Complex Procedure details:    Incision type: 2 straight incision in both areas of greatest induration and fluctuance.   Incision depth:  Dermal   Scalpel blade:  11   Wound management:  Probed and deloculated   Drainage:  Purulent and serosanguinous   Drainage amount:  Moderate   Wound treatment:  Wound left open   Packing materials:  1/4 in gauze Post-procedure details:    Judith Mccullough tolerance of procedure:  Tolerated with difficulty Comments:     Multiple attempts to anesthetize surrounding tissue and abscess cavity while within the limits of max dosing of anesthesia.  Judith Mccullough requested early termination during deloculating. No further active purulent drainage at time of termination.   (including critical care time) EMERGENCY DEPARTMENT US SOFT TISSUE INTERPRETATION "Study: Limited Soft Tissue Ultrasound"  INDICATIONS: Soft tissue infection Multiple views of the body part were obtained in real-time with a multi-frequency linear probe PERFORMED BY:  Myself IMAGES ARCHIVED?: No SIDE:Left BODY PART:Axilla FINDINGS: Abcess present, Cellulitis present and Other 2 foci of fluid collection that do not appear to be contiguous. INTERPRETATION:  Abcess present and Cellulitis present   CPT: Neck 25427-06  Upper extremity 23762-83  Axilla 15176-16  Chest wall 07371-06  Beast 26948-54  Upper back 62703-50  Lower back 09381-82  Abdominal wall 99371-69  Pelvic wall 67893-81  Lower extremity 01751-02  Other soft tissue 58527-78  Medications Ordered in ED Medications  lidocaine-EPINEPHrine (XYLOCAINE W/EPI) 2 %-1:200000 (PF) injection 10 mL (10 mLs Infiltration Given by Other 12/08/16 1019)  oxyCODONE (Oxy IR/ROXICODONE) immediate release tablet 5 mg (5 mg Oral Given 12/08/16 1249)  lidocaine (PF) (XYLOCAINE) 1 % injection 5 mL (5 mLs Infiltration Given by Other 12/08/16 1327)     Initial Impression / Assessment and Plan / ED Course  I have reviewed the triage vital signs and the nursing notes.  Pertinent labs & imaging results that were available during my care of the Judith Mccullough were reviewed by me and considered in my medical decision making (see chart for details).      Final Clinical Impressions(s) / ED Diagnoses   Final diagnoses:  Abscess of left axilla   Judith Mccullough is well-appearing and in no acute distress.  Pulse was rechecked in the room by me and found to be just over 100 bpm.  Additionally, Judith Mccullough did take Percocet with acetaminophen in it prior to my evaluation, so febrile status is unknown.  This was discussed with Dr. Carmin Muskrat.  Judith Mccullough's systemic symptoms of nausea are likely due to the skin and soft tissue infection in the left axilla.  This will require incision and drainage and antibiotics.  Due to the extent of purulent cellulitis in the axilla, packing was placed and Judith Mccullough was instructed to follow-up in 48 hours in this emergency department or at [facility] urgent care for removal and wound check.  I discussed return precautions for returning sooner such as increasing erythema, pain, swelling, worsening systemic symptoms of fever, or intractable nausea and vomiting.  Clindamycin for abx treatment per ED Antibiotic order set recommendations. Judith Mccullough with history of elevated creatinine per chart review, so Clindamycin selected over Bactrim. Judith Mccullough is in understanding and agrees with the plan of care.   I have reviewed the Judith Mccullough's information in the [place] for the past 12 months and matching Judith Mccullough with narcotic Rx not identified. Verified safe ride home prior to administration of antibiotics.   Opiates were prescribed for an acute, painful condition. The Judith Mccullough was given information on side effects and encouraged to use other, non-opiate pain medication primary, only using opiate medicine sparingly for severe pain.   ED Discharge Orders        Ordered    clindamycin (CLEOCIN) 150 MG capsule  3 times daily     12/08/16 1345    HYDROcodone-acetaminophen (NORCO/VICODIN) 5-325 MG tablet  Every 4 hours PRN     12/08/16 1345       Tamala Julian 12/08/16 1940    Carmin Muskrat, MD 12/09/16 [phone]

## 2016-12-08 NOTE — ED Notes (Signed)
ED Provider at bedside. 

## 2016-12-08 NOTE — ED Triage Notes (Signed)
Pt c/o "boil under left arm started 2 days ago" left axilla painful, reddened, not draining at present, has had one in same place has had it lanced in past.

## 2016-12-08 NOTE — Discharge Instructions (Addendum)
Please see the information and instructions below regarding your visit.  Your diagnoses today include:  1. Abscess of left axilla     Abscesses form when an infection in your skin starts to collect bacteria and white blood cells, walling it off from the rest of your body to protect you from a bigger infection. Risk factors for this type of infection include:  ?Break in the skin ?Diabetes ?Swollen areas  Sometimes the infection starts to spread to surrounding tissue, causing redness and swelling. We call this cellulitis.   Tests performed today include: See side panel of your discharge paperwork for testing performed today. Vital signs are listed at the bottom of these instructions.   Medications prescribed:    Take any prescribed medications only as prescribed, and any over the counter medications only as directed on the packaging.  1. Antibiotic. Clindamycin. Please take all of your antibiotics until finished.   You may develop abdominal discomfort or nausea from the antibiotic. If this occurs, you may take it with food. Some patients also get diarrhea with antibiotics. You may help offset this with probiotics which you can buy or get in yogurt. Do not eat or take the probiotics until 2 hours after your antibiotic. Some women develop vaginal yeast infections after antibiotics. If you develop unusual vaginal discharge after being on this medication, please see your primary care provider.   Some people develop allergies to antibiotics. Symptoms of antibiotic allergy can be mild and include a flat rash and itching. They can also be more serious and include:  ?Hives - Hives are raised, red patches of skin that are usually very itchy.  ?Lip or tongue swelling  ?Trouble swallowing or breathing  ?Blistering of the skin or mouth.  If you have any of these serious symptoms, please seek emergency medical care immediately.  2. Pain. Norco/Vicodin. You have been prescribed Norco for pain.  This is an opioid pain medication. You may take this medication every 4-6 hours as needed for pain. Only take this medication if you need it for breakthrough pain. You may combine this medicine with ibuprofen, a non-steroidal anti-inflammatory drug (NSAID) every 6 hours, so you are getting something for pain relief every 3 hours.  Do not combine this medication with Tylenol, as it may increase the risk of liver problems.  Do not combine this medication with alcohol.  Please be advised to avoid driving or operating heavy machinery while taking this medication, as it may make you drowsy or impair judgment.    Home care instructions:  Please follow any educational materials contained in this packet.   Some things that may promote healing of your wound and infection include:  Raise your arm or leg to reduce swelling - Raise the arm or leg up above the level of your heart 3 or 4 times a day, for 30 minutes each time. Keep the infected area clean and dry. You can take a shower or bath, but be sure to pat the area dry with a towel afterward. Do not put any antibiotic ointments or creams on the area. Reapply a dry gauze dressing any time the bandage has become soaked with drainage, or after cleansing the wound.  Apply warm compresses to the wound 3-4 times daily to encourage drainage.   Return instructions:  Please return to the Emergency Department if you experience worsening symptoms. You should return for reevaluation of your infection if you notice spreading redness, increased swelling, an abscess develops, or you develop signs  and symptoms of a systemic illness such as fever and chills.  Please return if you have any other emergent concerns.  Please have the packing removed in 48 hours either here at the emergency department or at the urgent care across the street at [facility] urgent care.  Additional Information:   Your vital signs today were: BP 138/86 (BP Location: Right Arm)    Pulse  (!) 111    Temp 99.7 F (37.6 C) (Oral)    Resp 18    Ht 5' 9.5" (1.765 m)    Wt 104.3 kg (230 lb)    SpO2 100%    BMI 33.48 kg/m  If your blood pressure (BP) was elevated on multiple readings during this visit above 130 for the top number or above 80 for the bottom number, please have this repeated by your primary care provider within one month. --------------  Thank you for allowing [place] to participate in your care today.

## 2016-12-10 ENCOUNTER — Ambulatory Visit (HOSPITAL_COMMUNITY)
Admission: EM | Admit: 2016-12-10 | Discharge: 2016-12-10 | Disposition: A | Discharge status: Home / Self Care | Payer: Medicare Other | Attending: Family Medicine | Admitting: Family Medicine

## 2016-12-10 ENCOUNTER — Encounter (HOSPITAL_COMMUNITY): Payer: Self-pay | Admitting: Emergency Medicine

## 2016-12-10 DIAGNOSIS — L02414 Cutaneous abscess of left upper limb: Secondary | ICD-10-CM | POA: Diagnosis not present

## 2016-12-10 DIAGNOSIS — Z5189 Encounter for other specified aftercare: Secondary | ICD-10-CM

## 2016-12-10 MED ORDER — HYDROCODONE-ACETAMINOPHEN 5-325 MG PO TABS: 1.0000 | ORAL_TABLET | Freq: Four times a day (QID) | ORAL | 0 refills | Status: DC | PRN

## 2016-12-10 NOTE — ED Triage Notes (Signed)
Pt here to have the packing removed from under left arm; had abscess drained on Friday

## 2016-12-10 NOTE — Discharge Instructions (Signed)
Gently wash the area twice a day with soap and water.

## 2016-12-10 NOTE — ED Provider Notes (Signed)
[place]   [id number] 12/10/16 Arrival Time: 1412   SUBJECTIVE:  Judith Mccullough is a 56 y.o. female who presents to the urgent care with complaint of packing needs to be removed from under left arm; had abscess drained on Friday     Past Medical History:  Diagnosis Date  . Anemia, unspecified 03/17/2011  . Anxiety   . Aortic regurgitation 03/17/2011   Pt. denies problem with her heart.  . Asthma   . Blood clot in vein   . Complex regional pain syndrome   . CVA (cerebral infarction)   . Depression 03/17/2011  . Diverticulitis   . Diverticulosis   . Hyperlipidemia 03/17/2011  . Hypertension   . Mitral valve regurgitation 03/17/2011   Pt. said she never had problem with her heart  . Peripheral neuropathy 03/17/2011   Pt. denies this problem  . Polysubstance abuse ([place]) 03/17/2011   positive cocaine 08/07/10 er visit; States she has never had problems with drugs.  . Renal insufficiency syndrome   . Unspecified asthma(493.90) 03/17/2011   Family History  Problem Relation Age of Onset  . Diabetes Mother   . Hypertension Mother   . Prostate cancer Father   . Hypertension Father   . Diabetes Father   . Breast cancer Paternal Aunt        x 2  . Breast cancer Maternal Aunt   . Breast cancer Maternal Aunt    Social History   Socioeconomic History  . Marital status: Divorced    Spouse name: Not on file  . Number of children: Not on file  . Years of education: Not on file  . Highest education level: Not on file  Social Needs  . Financial resource strain: Not on file  . Food insecurity - worry: Not on file  . Food insecurity - inability: Not on file  . Transportation needs - medical: Not on file  . Transportation needs - non-medical: Not on file  Occupational History  . Not on file  Tobacco Use  . Smoking status: Former Smoker    Types: Cigars  . Smokeless tobacco: Never Used  Substance and Sexual Activity  . Alcohol use: Yes    Comment: occasional beer    . Drug use: No    Comment: THC & cocaine + @ 08-07-10 ER visit  . Sexual activity: Yes    Birth control/protection: None  Other Topics Concern  . Not on file  Social History Narrative  . Not on file   No outpatient medications have been marked as taking for the 12/10/16 encounter [facility] Encounter).   Allergies  Allergen Reactions  . Cymbalta [Duloxetine Hcl] Other (See Comments)    Made me 'flip out'  . Klonopin [Clonazepam] Other (See Comments)    Makes me very drowsy  . Codeine Itching and Rash  . Latex Rash    blisters      ROS: As per HPI, remainder of ROS negative.   OBJECTIVE:   Vitals:   12/10/16 1506  BP: 127/75  Pulse: 93  Resp: 18  Temp: 98.6 F (37 C)  TempSrc: Oral  SpO2: 95%     General appearance: alert; no distress Eyes: PERRL; EOMI; conjunctiva normal HENT: normocephalic; atraumatic;  external ears normal without trauma; nasal mucosa normal; oral mucosa normal Neck: supple Back: no CVA tenderness Extremities: no cyanosis or edema; symmetrical with no gross deformities Skin: warm and dry; packing removed from left axilla with no drainage remaining.  Still tender.  Good arm ROM Neurologic: normal gait; grossly normal Psychological: alert and cooperative; normal mood and affect      Labs:  Results for orders placed or performed during the hospital encounter of 10/18/16  CBC with Differential/Platelet  Result Value Ref Range   WBC 7.8 4.0 - 10.5 K/uL   RBC 4.98 3.87 - 5.11 MIL/uL   Hemoglobin 13.0 12.0 - 15.0 g/dL   HCT 41.3 36.0 - 46.0 %   MCV 82.9 78.0 - 100.0 fL   MCH 26.1 26.0 - 34.0 pg   MCHC 31.5 30.0 - 36.0 g/dL   RDW 15.5 11.5 - 15.5 %   Platelets 382 150 - 400 K/uL   Neutrophils Relative % 41 %   Neutro Abs 3.2 1.7 - 7.7 K/uL   Lymphocytes Relative 42 %   Lymphs Abs 3.3 0.7 - 4.0 K/uL   Monocytes Relative 9 %   Monocytes Absolute 0.7 0.1 - 1.0 K/uL   Eosinophils Relative 7 %   Eosinophils Absolute 0.6 0.0 - 0.7 K/uL    Basophils Relative 1 %   Basophils Absolute 0.1 0.0 - 0.1 K/uL  Comprehensive metabolic panel  Result Value Ref Range   Sodium 138 135 - 145 mmol/L   Potassium 4.5 3.5 - 5.1 mmol/L   Chloride 105 101 - 111 mmol/L   CO2 24 22 - 32 mmol/L   Glucose, Bld 89 65 - 99 mg/dL   BUN 11 6 - 20 mg/dL   Creatinine, Ser 1.29 (H) 0.44 - 1.00 mg/dL   Calcium 9.0 8.9 - 10.3 mg/dL   Total Protein 7.3 6.5 - 8.1 g/dL   Albumin 3.8 3.5 - 5.0 g/dL   AST 32 15 - 41 U/L   ALT 18 14 - 54 U/L   Alkaline Phosphatase 70 38 - 126 U/L   Total Bilirubin 0.8 0.3 - 1.2 mg/dL   GFR calc non Af Amer 46 (L) >60 mL/min   GFR calc Af Amer 53 (L) >60 mL/min   Anion gap 9 5 - 15  Lipase, blood  Result Value Ref Range   Lipase 25 11 - 51 U/L  I-stat troponin, ED  Result Value Ref Range   Troponin i, poc 0.00 0.00 - 0.08 ng/mL   Comment 3            Labs Reviewed - No data to display  No results found.     ASSESSMENT & PLAN:  1. Visit for wound check     Meds ordered this encounter  Medications  . HYDROcodone-acetaminophen (NORCO) 5-325 MG tablet    Sig: Take 1 tablet by mouth every 6 (six) hours as needed for moderate pain.    Dispense:  12 tablet    Refill:  0    Reviewed expectations re: course of current medical issues. Questions answered. Outlined signs and symptoms indicating need for more acute intervention. Patient verbalized understanding. After Visit Summary given.    Procedures:      Robyn Haber, MD 12/10/16 1515

## 2017-01-17 ENCOUNTER — Ambulatory Visit (HOSPITAL_COMMUNITY)
Admission: EM | Admit: 2017-01-17 | Discharge: 2017-01-17 | Disposition: A | Discharge status: Home / Self Care | Payer: Medicare Other | Attending: Urgent Care | Admitting: Urgent Care

## 2017-01-17 ENCOUNTER — Encounter (HOSPITAL_COMMUNITY): Payer: Self-pay | Admitting: Family Medicine

## 2017-01-17 DIAGNOSIS — J069 Acute upper respiratory infection, unspecified: Secondary | ICD-10-CM | POA: Diagnosis not present

## 2017-01-17 DIAGNOSIS — B9789 Other viral agents as the cause of diseases classified elsewhere: Secondary | ICD-10-CM | POA: Diagnosis not present

## 2017-01-17 DIAGNOSIS — R062 Wheezing: Secondary | ICD-10-CM

## 2017-01-17 DIAGNOSIS — R0602 Shortness of breath: Secondary | ICD-10-CM

## 2017-01-17 DIAGNOSIS — J45909 Unspecified asthma, uncomplicated: Secondary | ICD-10-CM

## 2017-01-17 DIAGNOSIS — G9050 Complex regional pain syndrome I, unspecified: Secondary | ICD-10-CM

## 2017-01-17 MED ORDER — BENZONATATE 100 MG PO CAPS: 100.0000 mg | ORAL_CAPSULE | Freq: Three times a day (TID) | ORAL | 1 refills | Status: DC | PRN

## 2017-01-17 MED ORDER — HYDROCODONE-HOMATROPINE 5-1.5 MG/5ML PO SYRP: 5.0000 mL | ORAL_SOLUTION | Freq: Every evening | ORAL | 0 refills | Status: DC | PRN

## 2017-01-17 MED ORDER — METHYLPREDNISOLONE ACETATE 80 MG/ML IJ SUSP
INTRAMUSCULAR | Status: AC
  Filled 2017-01-17: qty 1

## 2017-01-17 MED ORDER — METHYLPREDNISOLONE ACETATE 80 MG/ML IJ SUSP
80.0000 mg | Freq: Once | INTRAMUSCULAR | Status: AC
  Administered 2017-01-17: 80 mg via INTRAMUSCULAR

## 2017-01-17 NOTE — Discharge Instructions (Signed)
For sore throat try using a honey-based tea. Use 3 teaspoons of honey with juice squeezed from half lemon. Place shaved pieces of ginger into 1/2-1 cup of water and warm over stove top. Then mix the ingredients and repeat every 4 hours as needed. 

## 2017-01-17 NOTE — ED Triage Notes (Signed)
Pt here for asthma flare and URI symptoms. X 3 days. Using albuterol with minimal relief

## 2017-01-17 NOTE — ED Provider Notes (Signed)
MRN: [id number] DOB: 25-Aug-1960  Subjective:   Judith Mccullough is a 57 y.o. female presenting for 3 day history of mildly productive cough, chest heaviness, chest congestion, shob, wheezing. Also reports sinus pain, headache, runny nose. Has been using albuterol nebulizer every 4 hours as needed with minimal relief. Denies fever, n/v, abdominal pain, ear pain, ear drainage, sore throat, chest pain, rashes. Denies smoking cigarettes. She also reports upper right/posterior back and shoulder pain. Has a history of regional pain syndrome. She is requesting pain medication for this but has not tried anything else currently.  No current facility-administered medications for this encounter.   Current Outpatient Medications:  .  albuterol (PROVENTIL HFA;VENTOLIN HFA) 108 (90 BASE) MCG/ACT inhaler, Inhale 2 puffs into the lungs every 4 (four) hours as needed for wheezing or shortness of breath., Disp: 1 Inhaler, Rfl: 0 .  albuterol (PROVENTIL) (2.5 MG/3ML) 0.083% nebulizer solution, Take 2.5 mg by nebulization every 6 (six) hours as needed for wheezing or shortness of breath., Disp: , Rfl:  .  amLODipine (NORVASC) 10 MG tablet, Take 10 mg by mouth daily.  , Disp: , Rfl:  .  aspirin EC 81 MG tablet, Take 81 mg by mouth daily., Disp: , Rfl:  .  benzonatate (TESSALON) 100 MG capsule, Take 1 capsule (100 mg total) by mouth every 8 (eight) hours., Disp: 10 capsule, Rfl: 0 .  Fluticasone-Salmeterol (ADVAIR) 250-50 MCG/DOSE AEPB, Inhale 1 puff into the lungs 2 (two) times daily., Disp: , Rfl:  .  gabapentin (NEURONTIN) 300 MG capsule, Take 1 capsule (300 mg total) by mouth 3 (three) times daily., Disp: 90 capsule, Rfl: 0 .  HYDROcodone-acetaminophen (NORCO) 5-325 MG tablet, Take 1 tablet by mouth every 6 (six) hours as needed for moderate pain., Disp: 12 tablet, Rfl: 0 .  hydrOXYzine (VISTARIL) 25 MG capsule, Take 25 mg by mouth 2 (two) times daily as needed for anxiety. , Disp: , Rfl: 0 .  montelukast  (SINGULAIR) 10 MG tablet, Take 10 mg by mouth every morning., Disp: , Rfl:  .  predniSONE (DELTASONE) 20 MG tablet, Take 60 mg daily x 2 days then 40 mg daily x 2 days then 20 mg daily x 2 days, Disp: 12 tablet, Rfl: 0 .  rosuvastatin (CRESTOR) 10 MG tablet, Take 10 mg by mouth daily.  , Disp: , Rfl:    Judith Mccullough is allergic to cymbalta [duloxetine hcl]; klonopin [clonazepam]; codeine; and latex.  Judith Mccullough  has a past medical history of Anemia, unspecified (03/17/2011), Anxiety, Aortic regurgitation (03/17/2011), Asthma, Blood clot in vein, Complex regional pain syndrome, CVA (cerebral infarction), Depression (03/17/2011), Diverticulitis, Diverticulosis, Hyperlipidemia (03/17/2011), Hypertension, Mitral valve regurgitation (03/17/2011), Peripheral neuropathy (03/17/2011), Polysubstance abuse ([place]) (03/17/2011), Renal insufficiency syndrome, and Unspecified asthma(493.90) (03/17/2011). Also  has a past surgical history that includes Knee arthroscopy (Right, 1992); orthopedic surgery; Cholecystectomy; and thumb surgery (Right, 2003).  Objective:   Vitals: BP (!) 143/90   Pulse 93   Temp 98.3 F (36.8 C)   Resp 20   LMP 01/03/2017   SpO2 99%   Physical Exam  Constitutional: She is oriented to person, place, and time. She appears well-developed and well-nourished.  HENT:  Mouth/Throat: Oropharynx is clear and moist.  Eyes: No scleral icterus.  Cardiovascular: Normal rate, regular rhythm and intact distal pulses. Exam reveals no gallop and no friction rub.  No murmur heard. Pulmonary/Chest: No respiratory distress. She has wheezes (throughout). She has no rales.  Neurological: She is alert and oriented to person, place, and  time.  Skin: Skin is warm and dry.  Psychiatric: She has a normal mood and affect.   Assessment and Plan :   Viral URI with cough  Shortness of breath  Wheezing  Uncomplicated asthma, unspecified asthma severity, unspecified whether persistent  Complex regional pain syndrome  type 1, affecting unspecified site   Start supportive care for viral URI. Scheduled nebulized albuterol, patient declined treatment in clinic. IM Depomedrol today. Cough suppression medications offered. At the end of her visit, patient requested pain medication for CRPS. She has not tried anything for this. I recommended she try APAP and to check with her PCP about additional interventions. I also pointed out that the cough syrup provided today does have hydrocodone and may help with her pain as well. Return-to-clinic precautions discussed, patient verbalized understanding.      Jaynee Eagles, PA-C 01/17/17 1120

## 2017-02-21 ENCOUNTER — Other Ambulatory Visit: Payer: Self-pay | Admitting: Nurse Practitioner

## 2017-02-21 DIAGNOSIS — Z1231 Encounter for screening mammogram for malignant neoplasm of breast: Secondary | ICD-10-CM

## 2017-02-22 ENCOUNTER — Other Ambulatory Visit: Payer: Self-pay

## 2017-02-22 ENCOUNTER — Ambulatory Visit (HOSPITAL_COMMUNITY)
Admission: EM | Admit: 2017-02-22 | Discharge: 2017-02-22 | Disposition: A | Discharge status: Home / Self Care | Payer: Medicare Other | Attending: Family Medicine | Admitting: Family Medicine

## 2017-02-22 ENCOUNTER — Encounter (HOSPITAL_COMMUNITY): Payer: Self-pay | Admitting: Emergency Medicine

## 2017-02-22 DIAGNOSIS — G90511 Complex regional pain syndrome I of right upper limb: Secondary | ICD-10-CM | POA: Diagnosis not present

## 2017-02-22 MED ORDER — KETOROLAC TROMETHAMINE 60 MG/2ML IM SOLN
INTRAMUSCULAR | Status: AC
  Filled 2017-02-22: qty 2

## 2017-02-22 MED ORDER — KETOROLAC TROMETHAMINE 60 MG/2ML IM SOLN
60.0000 mg | Freq: Once | INTRAMUSCULAR | Status: AC
  Administered 2017-02-22: 60 mg via INTRAMUSCULAR

## 2017-02-22 NOTE — Discharge Instructions (Signed)
Please schedule a follow up appointment with your primary doctor for further evaluation.

## 2017-02-22 NOTE — ED Triage Notes (Signed)
States " RSD" is flared up.  Chronic pain involving right shoulder down right arm.

## 2017-02-22 NOTE — ED Provider Notes (Signed)
[place]   [id number] 02/22/17 Arrival Time: 8242  ASSESSMENT & PLAN:  1. Complex regional pain syndrome type 1 of right upper extremity     Meds ordered this encounter  Medications  . ketorolac (TORADOL) injection 60 mg   She plans f/u with PCP if not seeing much improvement with Toradol. May f/u here as needed.  Reviewed expectations re: course of current medical issues. Questions answered. Outlined signs and symptoms indicating need for more acute intervention. Patient verbalized understanding. After Visit Summary given.   SUBJECTIVE: History from: patient. Judith Mccullough is a 56 y.o. female who presents with complex regional pain syndrome of her R upper extremity exacerbation. Reports 1-2 exacerbations/year. Toradol usually helps. Otherwise she is feeling well. Described "ache" of RUE worsened by certain movements. OTC analgesics without relief. No extremity sensation changes or weakness currently. ROS: As per HPI.   OBJECTIVE:  Vitals:   02/22/17 1217  BP: 139/78  Pulse: 83  Resp: 18  Temp: 98.1 F (36.7 C)  TempSrc: Oral  SpO2: 99%    General appearance: alert; no distress Lungs: clear to auscultation bilaterally Heart: regular rate and rhythm Extremities: RUE with reported pain from any movement; declines shoulder exam Skin: warm and dry Neurologic: normal symmetric reflexes Psychological: alert and cooperative; normal mood and affect   Allergies  Allergen Reactions  . Cymbalta [Duloxetine Hcl] Other (See Comments)    Made me 'flip out'  . Klonopin [Clonazepam] Other (See Comments)    Makes me very drowsy  . Latex Rash    blisters    Past Medical History:  Diagnosis Date  . Anemia, unspecified 03/17/2011  . Anxiety   . Aortic regurgitation 03/17/2011   Pt. denies problem with her heart.  . Asthma   . Blood clot in vein   . Complex regional pain syndrome   . CVA (cerebral infarction)   . Depression 03/17/2011  . Diverticulitis     . Diverticulosis   . Hyperlipidemia 03/17/2011  . Hypertension   . Mitral valve regurgitation 03/17/2011   Pt. said she never had problem with her heart  . Peripheral neuropathy 03/17/2011   Pt. denies this problem  . Polysubstance abuse ([place]) 03/17/2011   positive cocaine 08/07/10 er visit; States she has never had problems with drugs.  . Renal insufficiency syndrome   . Unspecified asthma(493.90) 03/17/2011   Social History   Socioeconomic History  . Marital status: Divorced    Spouse name: Not on file  . Number of children: Not on file  . Years of education: Not on file  . Highest education level: Not on file  Social Needs  . Financial resource strain: Not on file  . Food insecurity - worry: Not on file  . Food insecurity - inability: Not on file  . Transportation needs - medical: Not on file  . Transportation needs - non-medical: Not on file  Occupational History  . Not on file  Tobacco Use  . Smoking status: Former Smoker    Types: Cigars  . Smokeless tobacco: Never Used  Substance and Sexual Activity  . Alcohol use: Yes    Comment: occasional beer  . Drug use: No    Comment: THC & cocaine + @ 08-07-10 ER visit  . Sexual activity: Yes    Birth control/protection: None  Other Topics Concern  . Not on file  Social History Narrative  . Not on file   Family History  Problem Relation Age of Onset  .  Diabetes Mother   . Hypertension Mother   . Prostate cancer Father   . Hypertension Father   . Diabetes Father   . Breast cancer Paternal Aunt        x 2  . Breast cancer Maternal Aunt   . Breast cancer Maternal Aunt    Past Surgical History:  Procedure Laterality Date  . CHOLECYSTECTOMY    . KNEE ARTHROSCOPY Right 1992  . ORTHOPEDIC SURGERY    . thumb surgery Right 2003   R thumb reconstruction     Vanessa Kick, MD 02/22/17 1301

## 2017-04-02 ENCOUNTER — Ambulatory Visit
Admission: RE | Admit: 2017-04-02 | Discharge: 2017-04-02 | Disposition: A | Discharge status: Home / Self Care | Payer: Medicare Other | Source: Ambulatory Visit | Attending: Nurse Practitioner | Admitting: Nurse Practitioner

## 2017-04-02 DIAGNOSIS — Z1231 Encounter for screening mammogram for malignant neoplasm of breast: Secondary | ICD-10-CM

## 2017-06-26 ENCOUNTER — Encounter (HOSPITAL_COMMUNITY): Payer: Self-pay | Admitting: Family Medicine

## 2017-06-26 ENCOUNTER — Ambulatory Visit (HOSPITAL_COMMUNITY)
Admission: EM | Admit: 2017-06-26 | Discharge: 2017-06-26 | Disposition: A | Discharge status: Home / Self Care | Payer: Medicare Other | Attending: Family Medicine | Admitting: Family Medicine

## 2017-06-26 DIAGNOSIS — J4541 Moderate persistent asthma with (acute) exacerbation: Secondary | ICD-10-CM

## 2017-06-26 DIAGNOSIS — J069 Acute upper respiratory infection, unspecified: Secondary | ICD-10-CM | POA: Diagnosis not present

## 2017-06-26 MED ORDER — IPRATROPIUM-ALBUTEROL 0.5-2.5 (3) MG/3ML IN SOLN
RESPIRATORY_TRACT | Status: AC
  Filled 2017-06-26: qty 3

## 2017-06-26 MED ORDER — PREDNISONE 20 MG PO TABS: 40.0000 mg | ORAL_TABLET | Freq: Every day | ORAL | 0 refills | Status: AC

## 2017-06-26 MED ORDER — IPRATROPIUM-ALBUTEROL 0.5-2.5 (3) MG/3ML IN SOLN
3.0000 mL | Freq: Once | RESPIRATORY_TRACT | Status: AC
  Administered 2017-06-26: 3 mL via RESPIRATORY_TRACT

## 2017-06-26 MED ORDER — BENZONATATE 100 MG PO CAPS: 100.0000 mg | ORAL_CAPSULE | Freq: Three times a day (TID) | ORAL | 0 refills | Status: DC

## 2017-06-26 MED ORDER — METHYLPREDNISOLONE SODIUM SUCC 125 MG IJ SOLR
125.0000 mg | Freq: Once | INTRAMUSCULAR | Status: AC
  Administered 2017-06-26: 125 mg via INTRAMUSCULAR

## 2017-06-26 MED ORDER — IPRATROPIUM BROMIDE 0.06 % NA SOLN: 2.0000 | Freq: Two times a day (BID) | NASAL | 12 refills | Status: DC

## 2017-06-26 MED ORDER — METHYLPREDNISOLONE SODIUM SUCC 125 MG IJ SOLR
INTRAMUSCULAR | Status: AC
  Filled 2017-06-26: qty 2

## 2017-06-26 NOTE — Discharge Instructions (Signed)
Push fluids to ensure adequate hydration and keep secretions thin.  Tylenol and/or ibuprofen as needed for pain or fevers.  Continue with your maintenance medications as prescribed, use of inhaler and/or nebs as ordered.  We will do 5 days of prednisone as well as use of nasal spray 2-4 times a day for congestion.  Tessalon as needed.  If symptoms worsen or do not improve in the next week to return to be seen or to follow up with you PCP.   If worsening breathing, fevers, shortness of breath , chest pain  please go to Er.

## 2017-06-26 NOTE — ED Provider Notes (Addendum)
[facility]    CSN: [id number] Arrival date & time: 06/26/17  1023     History   Chief Complaint Chief Complaint  Patient presents with  . Cough  . Nasal Congestion  . Wheezing    HPI Judith Mccullough is a 57 y.o. female.   Judith Mccullough presents with complaints of congestion, cough, wheezing, shortness of breath  Which started 3 days ago. This morning vomited due to cough severity. Denies any nausea or diarrhea. No abdominal pain. No fevers. No known ill contacts. No pain unless coughing which has not become painful. Has been using home nebs and inhaler which have not helped. History of asthma, is on maintenance medications as well. Runny nose. No sore throat. Hx of diverticulitis, anemia, anxiety, cva, htn, mitral valve regurgitation, asthma.     ROS per HPI.      Past Medical History:  Diagnosis Date  . Anemia, unspecified 03/17/2011  . Anxiety   . Aortic regurgitation 03/17/2011   Pt. denies problem with her heart.  . Asthma   . Blood clot in vein   . Complex regional pain syndrome   . CVA (cerebral infarction)   . Depression 03/17/2011  . Diverticulitis   . Diverticulosis   . Hyperlipidemia 03/17/2011  . Hypertension   . Mitral valve regurgitation 03/17/2011   Pt. said she never had problem with her heart  . Peripheral neuropathy 03/17/2011   Pt. denies this problem  . Polysubstance abuse ([place]) 03/17/2011   positive cocaine 08/07/10 er visit; States she has never had problems with drugs.  . Renal insufficiency syndrome   . Unspecified asthma(493.90) 03/17/2011    Patient Active Problem List   Diagnosis Date Noted  . CVA (cerebral infarction) 03/17/2011  . Unspecified asthma(493.90) 03/17/2011  . Preventative health care 03/17/2011  . Polysubstance abuse ([place]) 03/17/2011  . Hyperlipidemia 03/17/2011  . Anemia, unspecified 03/17/2011  . Aortic regurgitation 03/17/2011  . Mitral valve regurgitation 03/17/2011  . Depression 03/17/2011  . Peripheral  neuropathy 03/17/2011  . Hypertension   . Anxiety     Past Surgical History:  Procedure Laterality Date  . CHOLECYSTECTOMY    . KNEE ARTHROSCOPY Right 1992  . ORTHOPEDIC SURGERY    . thumb surgery Right 2003   R thumb reconstruction    OB History   None      Home Medications    Prior to Admission medications   Medication Sig Start Date End Date Taking? Authorizing Provider  albuterol (PROVENTIL HFA;VENTOLIN HFA) 108 (90 BASE) MCG/ACT inhaler Inhale 2 puffs into the lungs every 4 (four) hours as needed for wheezing or shortness of breath. 11/30/13   Janne Napoleon, NP  albuterol (PROVENTIL) (2.5 MG/3ML) 0.083% nebulizer solution Take 2.5 mg by nebulization every 6 (six) hours as needed for wheezing or shortness of breath.    [provider]  amLODipine (NORVASC) 10 MG tablet Take 10 mg by mouth daily.      [provider]  aspirin EC 81 MG tablet Take 81 mg by mouth daily.    [provider]  benzonatate (TESSALON) 100 MG capsule Take 1 capsule (100 mg total) by mouth every 8 (eight) hours. 06/26/17   Zigmund Gottron, NP  Fluticasone-Salmeterol (ADVAIR) 250-50 MCG/DOSE AEPB Inhale 1 puff into the lungs 2 (two) times daily.    [provider]  ipratropium (ATROVENT) 0.06 % nasal spray Place 2 sprays into both nostrils 2 (two) times daily. 06/26/17   Zigmund Gottron, NP  montelukast (SINGULAIR) 10 MG tablet Take 10 mg by mouth every morning.    [provider]  predniSONE (DELTASONE) 20 MG tablet Take 2 tablets (40 mg total) by mouth daily with breakfast for 5 days. 06/26/17 07/01/17  Zigmund Gottron, NP  rosuvastatin (CRESTOR) 10 MG tablet Take 10 mg by mouth daily.      [provider]    Family History Family History  Problem Relation Age of Onset  . Diabetes Mother   . Hypertension Mother   . Prostate cancer Father   . Hypertension Father   . Diabetes Father   . Breast cancer Paternal Aunt        x 2, ? ages of onset  .  Breast cancer Maternal Aunt 60  . Breast cancer Maternal Aunt 38    Social History Social History   Tobacco Use  . Smoking status: Former Smoker    Types: Cigars  . Smokeless tobacco: Never Used  Substance Use Topics  . Alcohol use: Yes    Comment: occasional beer  . Drug use: No    Comment: THC & cocaine + @ 08-07-10 ER visit     Allergies   Cymbalta [duloxetine hcl]; Klonopin [clonazepam]; and Latex   Review of Systems Review of Systems   Physical Exam Triage Vital Signs ED Triage Vitals  Enc Vitals Group     BP 06/26/17 1124 (!) 144/80     Pulse Rate 06/26/17 1124 98     Resp 06/26/17 1124 20     Temp 06/26/17 1124 98.7 F (37.1 C)     Temp src --      SpO2 06/26/17 1124 98 %     Weight --      Height --      Head Circumference --      Peak Flow --      Pain Score 06/26/17 1122 0     Pain Loc --      Pain Edu? --      Excl. in [place]? --    No data found.  Updated Vital Signs BP (!) 144/80   Pulse 98   Temp 98.7 F (37.1 C)   Resp 20   LMP 06/06/2017   SpO2 98%    Physical Exam  Constitutional: She is oriented to person, place, and time. She appears well-developed and well-nourished. No distress.  HENT:  Head: Normocephalic and atraumatic.  Right Ear: Tympanic membrane, external ear and ear canal normal.  Left Ear: Tympanic membrane, external ear and ear canal normal.  Nose: Rhinorrhea present. Right sinus exhibits no maxillary sinus tenderness and no frontal sinus tenderness. Left sinus exhibits no maxillary sinus tenderness and no frontal sinus tenderness.  Mouth/Throat: Uvula is midline, oropharynx is clear and moist and mucous membranes are normal. No tonsillar exudate.  Eyes: Pupils are equal, round, and reactive to light. Conjunctivae and EOM are normal.  Cardiovascular: Normal rate, regular rhythm and normal heart sounds.  Pulmonary/Chest: Effort normal. No accessory muscle usage. Tachypnea noted. No respiratory distress. She has wheezes.    Wheezing throughout, strong course cough noted   Neurological: She is alert and oriented to person, place, and time.  Skin: Skin is warm and dry.     UC Treatments / Results  Labs (all labs ordered are listed, but only abnormal results are displayed) Labs Reviewed - No data to display  EKG None  Radiology No results found.  Procedures Procedures (including critical care time)  Medications Ordered  in UC Medications  ipratropium-albuterol (DUONEB) 0.5-2.5 (3) MG/3ML nebulizer solution 3 mL (3 mLs Nebulization Given 06/26/17 1209)  methylPREDNISolone sodium succinate (SOLU-MEDROL) 125 mg/2 mL injection 125 mg (125 mg Intramuscular Given 06/26/17 1209)    Initial Impression / Assessment and Plan / UC Course  I have reviewed the triage vital signs and the nursing notes.  Pertinent labs & imaging results that were available during my care of the patient were reviewed by me and considered in my medical decision making (see chart for details).     Patient declines xray today. Patient endorses feeling breathing much improved even after neb in clinic. No respiratory distress, ambulatory without difficulty. No tachypnea, tachycardia or hypoxia. URI with asthma flair. 5 days of prednisone after solumedrol here today. Continue with supportive cares for URI symptoms. Return precautions provided. Patient verbalized understanding and agreeable to plan.  Ambulatory out of clinic without difficulty.    Final Clinical Impressions(s) / UC Diagnoses   Final diagnoses:  Upper respiratory tract infection, unspecified type  Moderate persistent asthma with exacerbation     Discharge Instructions     Push fluids to ensure adequate hydration and keep secretions thin.  Tylenol and/or ibuprofen as needed for pain or fevers.  Continue with your maintenance medications as prescribed, use of inhaler and/or nebs as ordered.  We will do 5 days of prednisone as well as use of nasal spray 2-4 times a day  for congestion.  Tessalon as needed.  If symptoms worsen or do not improve in the next week to return to be seen or to follow up with you PCP.   If worsening breathing, fevers, shortness of breath , chest pain  please go to Er.     ED Prescriptions    Medication Sig Dispense Auth. Provider   predniSONE (DELTASONE) 20 MG tablet Take 2 tablets (40 mg total) by mouth daily with breakfast for 5 days. 10 tablet Augusto Gamble B, NP   benzonatate (TESSALON) 100 MG capsule Take 1 capsule (100 mg total) by mouth every 8 (eight) hours. 21 capsule Augusto Gamble B, NP   ipratropium (ATROVENT) 0.06 % nasal spray Place 2 sprays into both nostrils 2 (two) times daily. 15 mL Augusto Gamble B, NP     Controlled Substance Prescriptions [place] Controlled Substance Registry consulted? Not Applicable       Zigmund Gottron, NP 06/26/17 1218

## 2017-06-26 NOTE — ED Triage Notes (Signed)
Pt here for cough, congestion, wheezing, runny nose since Friday. She is taking tusson for cough. She has been using her inhaler.

## 2017-07-09 ENCOUNTER — Encounter: Payer: Self-pay | Admitting: Allergy and Immunology

## 2017-08-23 ENCOUNTER — Encounter (HOSPITAL_COMMUNITY): Payer: Self-pay | Admitting: Emergency Medicine

## 2017-08-23 ENCOUNTER — Ambulatory Visit (HOSPITAL_COMMUNITY)
Admission: EM | Admit: 2017-08-23 | Discharge: 2017-08-23 | Disposition: A | Discharge status: Home / Self Care | Payer: Medicare Other | Attending: Family Medicine | Admitting: Family Medicine

## 2017-08-23 ENCOUNTER — Other Ambulatory Visit: Payer: Self-pay

## 2017-08-23 DIAGNOSIS — R062 Wheezing: Secondary | ICD-10-CM

## 2017-08-23 DIAGNOSIS — M62838 Other muscle spasm: Secondary | ICD-10-CM

## 2017-08-23 DIAGNOSIS — G9050 Complex regional pain syndrome I, unspecified: Secondary | ICD-10-CM

## 2017-08-23 MED ORDER — CYCLOBENZAPRINE HCL 10 MG PO TABS: 10.0000 mg | ORAL_TABLET | Freq: Every evening | ORAL | 0 refills | Status: DC | PRN

## 2017-08-23 MED ORDER — NAPROXEN 375 MG PO TABS: 375.0000 mg | ORAL_TABLET | Freq: Two times a day (BID) | ORAL | 0 refills | Status: DC

## 2017-08-23 NOTE — ED Provider Notes (Signed)
[facility]   [id number] 08/23/17 Arrival Time: 5643  SUBJECTIVE: History from: patient. Judith Mccullough is a 57 y.o. female hx of asthma, and complex regional pain syndrome complains of right sided neck and arm pain that began 1 day ago.  Began after lifting heavy boxes at work.  Pain is diffuse about the right side of neck and shoulder.  Describes the pain as constant and sharp in character.  Has NOT tried OTC medications.  Symptoms are made worse with shoulder ROM.  Reports similar symptoms in the past, but never this severe.  Complains of ecchymosis, and swelling.  Denies fever, chills, erythema, weakness, numbness and tingling.      ROS: As per HPI.  Past Medical History:  Diagnosis Date  . Anemia, unspecified 03/17/2011  . Anxiety   . Aortic regurgitation 03/17/2011   Pt. denies problem with her heart.  . Asthma   . Blood clot in vein   . Complex regional pain syndrome   . CVA (cerebral infarction)   . Depression 03/17/2011  . Diverticulitis   . Diverticulosis   . Hyperlipidemia 03/17/2011  . Hypertension   . Mitral valve regurgitation 03/17/2011   Pt. said she never had problem with her heart  . Peripheral neuropathy 03/17/2011   Pt. denies this problem  . Polysubstance abuse ([place]) 03/17/2011   positive cocaine 08/07/10 er visit; States she has never had problems with drugs.  . Renal insufficiency syndrome   . Unspecified asthma(493.90) 03/17/2011   Past Surgical History:  Procedure Laterality Date  . CHOLECYSTECTOMY    . KNEE ARTHROSCOPY Right 1992  . ORTHOPEDIC SURGERY    . thumb surgery Right 2003   R thumb reconstruction   Allergies  Allergen Reactions  . Cymbalta [Duloxetine Hcl] Other (See Comments)    Made me 'flip out'  . Klonopin [Clonazepam] Other (See Comments)    Makes me very drowsy  . Latex Rash    blisters   No current facility-administered medications on file prior to encounter.    Current Outpatient Medications on File Prior to Encounter    Medication Sig Dispense Refill  . amLODipine (NORVASC) 10 MG tablet Take 10 mg by mouth daily.      . Azelastine-Fluticasone (DYMISTA NA) Place into the nose.    . mometasone-formoterol (DULERA) 100-5 MCG/ACT AERO Inhale 2 puffs into the lungs 2 (two) times daily.    . montelukast (SINGULAIR) 10 MG tablet Take 10 mg by mouth every morning.    . rosuvastatin (CRESTOR) 10 MG tablet Take 10 mg by mouth daily.      Marland Kitchen albuterol (PROVENTIL HFA;VENTOLIN HFA) 108 (90 BASE) MCG/ACT inhaler Inhale 2 puffs into the lungs every 4 (four) hours as needed for wheezing or shortness of breath. 1 Inhaler 0  . albuterol (PROVENTIL) (2.5 MG/3ML) 0.083% nebulizer solution Take 2.5 mg by nebulization every 6 (six) hours as needed for wheezing or shortness of breath.    Marland Kitchen aspirin EC 81 MG tablet Take 81 mg by mouth daily.    . benzonatate (TESSALON) 100 MG capsule Take 1 capsule (100 mg total) by mouth every 8 (eight) hours. 21 capsule 0  . Fluticasone-Salmeterol (ADVAIR) 250-50 MCG/DOSE AEPB Inhale 1 puff into the lungs 2 (two) times daily.    Marland Kitchen ipratropium (ATROVENT) 0.06 % nasal spray Place 2 sprays into both nostrils 2 (two) times daily. 15 mL 12   Social History   Socioeconomic History  . Marital status: Divorced    Spouse name:  Not on file  . Number of children: Not on file  . Years of education: Not on file  . Highest education level: Not on file  Occupational History  . Not on file  Social Needs  . Financial resource strain: Not on file  . Food insecurity:    Worry: Not on file    Inability: Not on file  . Transportation needs:    Medical: Not on file    Non-medical: Not on file  Tobacco Use  . Smoking status: Former Smoker    Types: Cigars  . Smokeless tobacco: Never Used  Substance and Sexual Activity  . Alcohol use: Yes    Comment: occasional beer  . Drug use: No    Comment: THC & cocaine + @ 08-07-10 ER visit  . Sexual activity: Yes    Birth control/protection: None  Lifestyle  .  Physical activity:    Days per week: Not on file    Minutes per session: Not on file  . Stress: Not on file  Relationships  . Social connections:    Talks on phone: Not on file    Gets together: Not on file    Attends religious service: Not on file    Active member of club or organization: Not on file    Attends meetings of clubs or organizations: Not on file    Relationship status: Not on file  . Intimate partner violence:    Fear of current or ex partner: Not on file    Emotionally abused: Not on file    Physically abused: Not on file    Forced sexual activity: Not on file  Other Topics Concern  . Not on file  Social History Narrative  . Not on file   Family History  Problem Relation Age of Onset  . Diabetes Mother   . Hypertension Mother   . Prostate cancer Father   . Hypertension Father   . Diabetes Father   . Breast cancer Paternal Aunt        x 2, ? ages of onset  . Breast cancer Maternal Aunt 60  . Breast cancer Maternal Aunt 38    OBJECTIVE:  Vitals:   08/23/17 0958  BP: (!) 154/92  Pulse: (!) 101  Resp: 20  Temp: 98.9 F (37.2 C)  TempSrc: Oral  SpO2: 97%    General appearance: AOx3; in no acute distress. Appears uncomfortable, but nontoxic appearance Head: NCAT Lungs: Scatter wheezing appreciated throughout bilateral lungs Heart: RRR.  Clear S1 and S2 without murmur, gallops, or rubs.  Radial pulses 2+ bilaterally. Musculoskeletal: Neck Inspection: Skin warm, dry, clear and intact without obvious erythema, effusion, or ecchymosis.  Palpation: Diffusely tender about the right superior aspect of the trapezius ROM: LROM Strength: 5/5 shld abduction, 5/5 shld adduction, 5/5 elbow flexion, 5/5 elbow extension, 5/5 grip strength Skin: warm and dry Neurologic: Ambulates without difficulty; Sensation intact about the upper extremities Psychological: alert and cooperative; normal mood and affect  ASSESSMENT & PLAN:  1. Trapezius muscle spasm   2.  Complex regional pain syndrome type 1, affecting unspecified site    Meds ordered this encounter  Medications  . naproxen (NAPROSYN) 375 MG tablet    Sig: Take 1 tablet (375 mg total) by mouth 2 (two) times daily.    Dispense:  20 tablet    Refill:  0    Order Specific Question:   Supervising Provider    Answer:   Wynona Luna [phone]  .  cyclobenzaprine (FLEXERIL) 10 MG tablet    Sig: Take 1 tablet (10 mg total) by mouth at bedtime as needed for muscle spasms.    Dispense:  20 tablet    Refill:  0    Order Specific Question:   Supervising Provider    Answer:   Wynona Luna [[id number]]   Declines toradol shot in office Continue conservative management of rest, ice, heat, and gentle stretches Take naproxen as needed for pain relief (may cause abdominal discomfort, ulcers, and GI bleeds avoid taking with other NSAIDs) Take cyclobenzaprine at nighttime for symptomatic relief. Avoid driving or operating heavy machinery while using medication. Follow up with PCP if symptoms persist Return or go to the ER if you have any new or worsening symptoms (fever, chills, chest pain, abdominal pain, changes in bowel or bladder habits, pain radiating into lower legs, etc...)   Wheezing heard on exam today. Declines inhaler in office.  Will continue at home inhaler use Will return or follow up with PCP if symptoms persists  Reviewed expectations re: course of current medical issues. Questions answered. Outlined signs and symptoms indicating need for more acute intervention. Patient verbalized understanding. After Visit Summary given.    Lestine Box, PA-C 08/23/17 1036

## 2017-08-23 NOTE — ED Triage Notes (Addendum)
Medical record indicates complex regional pain syndrome.  Patient has had this since 2003.  Pain has soreness in neck, shoulder and right arm.  Patient vomited x 1.

## 2017-08-23 NOTE — Discharge Instructions (Addendum)
Declines toradol shot in office Continue conservative management of rest, ice, heat, and gentle stretches Take naproxen as needed for pain relief (may cause abdominal discomfort, ulcers, and GI bleeds avoid taking with other NSAIDs) Take cyclobenzaprine at nighttime for symptomatic relief. Avoid driving or operating heavy machinery while using medication. Follow up with PCP if symptoms persist Return or go to the ER if you have any new or worsening symptoms (fever, chills, chest pain, abdominal pain, changes in bowel or bladder habits, pain radiating into lower legs, etc...)   Wheezing heard on exam today. Declines inhaler in office.  Will continue at home inhaler use Will return or follow up with PCP if symptoms persists

## 2017-08-24 ENCOUNTER — Encounter: Payer: Self-pay | Admitting: Family Medicine

## 2017-08-24 ENCOUNTER — Ambulatory Visit (INDEPENDENT_AMBULATORY_CARE_PROVIDER_SITE_OTHER): Payer: Medicare Other | Admitting: Family Medicine

## 2017-08-24 VITALS — BP 112/72 | HR 117 | Temp 98.3°F | Resp 24 | Ht 68.5 in | Wt 220.0 lb

## 2017-08-24 DIAGNOSIS — J4541 Moderate persistent asthma with (acute) exacerbation: Secondary | ICD-10-CM | POA: Diagnosis not present

## 2017-08-24 DIAGNOSIS — R0902 Hypoxemia: Secondary | ICD-10-CM

## 2017-08-24 MED ORDER — IPRATROPIUM BROMIDE 0.02 % IN SOLN
0.5000 mg | Freq: Once | RESPIRATORY_TRACT | Status: AC
  Administered 2017-08-24: 0.5 mg via RESPIRATORY_TRACT

## 2017-08-24 MED ORDER — ALBUTEROL SULFATE (2.5 MG/3ML) 0.083% IN NEBU
2.5000 mg | INHALATION_SOLUTION | Freq: Once | RESPIRATORY_TRACT | Status: AC
  Administered 2017-08-24: 2.5 mg via RESPIRATORY_TRACT

## 2017-08-24 MED ORDER — PREDNISONE 10 MG (21) PO TBPK: ORAL_TABLET | ORAL | 0 refills | Status: DC

## 2017-08-24 MED ORDER — PREDNISONE 10 MG (21) PO TBPK: ORAL_TABLET | ORAL | 0 refills | Status: AC

## 2017-08-24 MED ORDER — METHYLPREDNISOLONE SODIUM SUCC 125 MG IJ SOLR
125.0000 mg | Freq: Once | INTRAMUSCULAR | Status: AC
  Administered 2017-08-24: 125 mg via INTRAMUSCULAR

## 2017-08-24 NOTE — Progress Notes (Signed)
albu

## 2017-08-24 NOTE — Patient Instructions (Signed)
Asthma, Acute Bronchospasm °Acute bronchospasm caused by asthma is also referred to as an asthma attack. Bronchospasm means your air passages become narrowed. The narrowing is caused by inflammation and tightening of the muscles in the air tubes (bronchi) in your lungs. This can make it hard to breathe or cause you to wheeze and cough. °What are the causes? °Possible triggers are: °· Animal dander from the skin, hair, or feathers of animals. °· Dust mites contained in house dust. °· Cockroaches. °· Pollen from trees or grass. °· Mold. °· Cigarette or tobacco smoke. °· Air pollutants such as dust, household cleaners, hair sprays, aerosol sprays, paint fumes, strong chemicals, or strong odors. °· Cold air or weather changes. Cold air may trigger inflammation. Winds increase molds and pollens in the air. °· Strong emotions such as crying or laughing hard. °· Stress. °· Certain medicines such as aspirin or beta-blockers. °· Sulfites in foods and drinks, such as dried fruits and wine. °· Infections or inflammatory conditions, such as a flu, cold, or inflammation of the nasal membranes (rhinitis). °· Gastroesophageal reflux disease (GERD). GERD is a condition where stomach acid backs up into your esophagus. °· Exercise or strenuous activity. ° °What are the signs or symptoms? °· Wheezing. °· Excessive coughing, particularly at night. °· Chest tightness. °· Shortness of breath. °How is this diagnosed? °Your health care provider will ask you about your medical history and perform a physical exam. A chest X-ray or blood testing may be performed to look for other causes of your symptoms or other conditions that may have triggered your asthma attack. °How is this treated? °Treatment is aimed at reducing inflammation and opening up the airways in your lungs. Most asthma attacks are treated with inhaled medicines. These include quick relief or rescue medicines (such as bronchodilators) and controller medicines (such as inhaled  corticosteroids). These medicines are sometimes given through an inhaler or a nebulizer. Systemic steroid medicine taken by mouth or given through an IV tube also can be used to reduce the inflammation when an attack is moderate or severe. Antibiotic medicines are only used if a bacterial infection is present. °Follow these instructions at home: °· Rest. °· Drink plenty of liquids. This helps the mucus to remain thin and be easily coughed up. Only use caffeine in moderation and do not use alcohol until you have recovered from your illness. °· Do not smoke. Avoid being exposed to secondhand smoke. °· You play a critical role in keeping yourself in good health. Avoid exposure to things that cause you to wheeze or to have breathing problems. °· Keep your medicines up-to-date and available. Carefully follow your health care provider’s treatment plan. °· Take your medicine exactly as prescribed. °· When pollen or pollution is bad, keep windows closed and use an air conditioner or go to places with air conditioning. °· Asthma requires careful medical care. See your health care provider for a follow-up as advised. If you are more than [redacted] weeks pregnant and you were prescribed any new medicines, let your obstetrician know about the visit and how you are doing. Follow up with your health care provider as directed. °· After you have recovered from your asthma attack, make an appointment with your outpatient doctor to talk about ways to reduce the likelihood of future attacks. If you do not have a doctor who manages your asthma, make an appointment with a primary care doctor to discuss your asthma. °Get help right away if: °· You are getting worse. °·   You have trouble breathing. If severe, call your local emergency services (911 in the [place] °· You develop chest pain or discomfort. °· You are vomiting. °· You are not able to keep fluids down. °· You are coughing up yellow, green, brown, or bloody sputum. °· You have a fever  and your symptoms suddenly get worse. °· You have trouble swallowing. °This information is not intended to replace advice given to you by your health care provider. Make sure you discuss any questions you have with your health care provider. °Document Released: 04/19/2006 Document Revised: 06/16/2015 Document Reviewed: 07/10/2012 °[employer]vier Interactive Patient Education © 2017 [employer] ° °

## 2017-08-24 NOTE — Progress Notes (Signed)
Chief Complaint  Patient presents with  . Asthma    started yesterday after being around smoke.  used inhaler 3x this morning.     HPI  Pt is a new patient to our practice She sees a PCP at [facility] Her daily medications for asthma are singulair and albuterol prn as well as dulera for her controller medication She reports that someone was smoking yesterday and it set her off asthma and now she is short of breath and wheezing constantly.  She states that she has been good control but smoke, dust and allergies are her triggers.  She denies chest pain, back pain or dizziness She has been using her albuterol but without relief.   Past Medical History:  Diagnosis Date  . Anemia, unspecified 03/17/2011  . Anxiety   . Aortic regurgitation 03/17/2011   Pt. denies problem with her heart.  . Asthma   . Blood clot in vein   . Complex regional pain syndrome   . CVA (cerebral infarction)   . Depression 03/17/2011  . Diverticulitis   . Diverticulosis   . Hyperlipidemia 03/17/2011  . Hypertension   . Mitral valve regurgitation 03/17/2011   Pt. said she never had problem with her heart  . Peripheral neuropathy 03/17/2011   Pt. denies this problem  . Polysubstance abuse ([place]) 03/17/2011   positive cocaine 08/07/10 er visit; States she has never had problems with drugs.  . Renal insufficiency syndrome   . Unspecified asthma(493.90) 03/17/2011    Current Outpatient Medications  Medication Sig Dispense Refill  . albuterol (PROVENTIL HFA;VENTOLIN HFA) 108 (90 BASE) MCG/ACT inhaler Inhale 2 puffs into the lungs every 4 (four) hours as needed for wheezing or shortness of breath. 1 Inhaler 0  . albuterol (PROVENTIL) (2.5 MG/3ML) 0.083% nebulizer solution Take 2.5 mg by nebulization every 6 (six) hours as needed for wheezing or shortness of breath.    Marland Kitchen amLODipine (NORVASC) 10 MG tablet Take 10 mg by mouth daily.      Marland Kitchen aspirin EC 81 MG tablet Take 81 mg by mouth daily.    . Azelastine-Fluticasone (DYMISTA NA)  Place into the nose.    . cyclobenzaprine (FLEXERIL) 10 MG tablet Take 1 tablet (10 mg total) by mouth at bedtime as needed for muscle spasms. 20 tablet 0  . mometasone-formoterol (DULERA) 100-5 MCG/ACT AERO Inhale 2 puffs into the lungs 2 (two) times daily.    . montelukast (SINGULAIR) 10 MG tablet Take 10 mg by mouth every morning.    . naproxen (NAPROSYN) 375 MG tablet Take 1 tablet (375 mg total) by mouth 2 (two) times daily. 20 tablet 0  . predniSONE (STERAPRED UNI-PAK 21 TAB) 10 MG (21) TBPK tablet Take 6 tablets (60 mg total) by mouth daily for 2 days, THEN 5 tablets (50 mg total) daily for 2 days, THEN 4 tablets (40 mg total) daily for 2 days, THEN 3 tablets (30 mg total) daily for 2 days, THEN 2 tablets (20 mg total) daily for 2 days, THEN 1 tablet (10 mg total) daily for 2 days. 42 tablet 0  . rosuvastatin (CRESTOR) 10 MG tablet Take 10 mg by mouth daily.       No current facility-administered medications for this visit.     Allergies:  Allergies  Allergen Reactions  . Cymbalta [Duloxetine Hcl] Other (See Comments)    Made me 'flip out'  . Klonopin [Clonazepam] Other (See Comments)    Makes me very drowsy  . Latex Rash  blisters    Past Surgical History:  Procedure Laterality Date  . CHOLECYSTECTOMY    . KNEE ARTHROSCOPY Right 1992  . ORTHOPEDIC SURGERY    . thumb surgery Right 2003   R thumb reconstruction    Social History   Socioeconomic History  . Marital status: Divorced    Spouse name: Not on file  . Number of children: Not on file  . Years of education: Not on file  . Highest education level: Not on file  Occupational History  . Not on file  Social Needs  . Financial resource strain: Not on file  . Food insecurity:    Worry: Not on file    Inability: Not on file  . Transportation needs:    Medical: Not on file    Non-medical: Not on file  Tobacco Use  . Smoking status: Former Smoker    Types: Cigars  . Smokeless tobacco: Never Used  Substance  and Sexual Activity  . Alcohol use: Yes    Comment: occasional beer  . Drug use: No    Comment: THC & cocaine + @ 08-07-10 ER visit  . Sexual activity: Yes    Birth control/protection: None  Lifestyle  . Physical activity:    Days per week: Not on file    Minutes per session: Not on file  . Stress: Not on file  Relationships  . Social connections:    Talks on phone: Not on file    Gets together: Not on file    Attends religious service: Not on file    Active member of club or organization: Not on file    Attends meetings of clubs or organizations: Not on file    Relationship status: Not on file  Other Topics Concern  . Not on file  Social History Narrative  . Not on file    Family History  Problem Relation Age of Onset  . Diabetes Mother   . Hypertension Mother   . Prostate cancer Father   . Hypertension Father   . Diabetes Father   . Breast cancer Paternal Aunt        x 2, ? ages of onset  . Breast cancer Maternal Aunt 60  . Breast cancer Maternal Aunt 51     ROS Review of Systems See HPI Constitution: No fevers or chills No malaise No diaphoresis Skin: No rash or itching Eyes: no blurry vision, no double vision GU: no dysuria or hematuria Neuro: no dizziness or headaches all others reviewed and negative   Objective: Vitals:   08/24/17 0844 08/24/17 1003 08/24/17 1034  BP: 112/72    Pulse: (!) 117    Resp: (!) 24    Temp: 98.3 F (36.8 C)    TempSrc: Oral    SpO2: (!) 85% 90% 97%  Weight: 220 lb (99.8 kg)    Height: 5' 8.5" (1.74 m)      Physical Exam  General: alert, oriented, in NAD Head: normocephalic, atraumatic, no sinus tenderness Eyes: EOM intact, no scleral icterus or conjunctival injection Ears: TM clear bilaterally Nose: mucosa nonerythematous, nonedematous Throat: no pharyngeal exudate or erythema Lymph: no posterior auricular, submental or cervical lymph adenopathy Heart: normal rate, normal sinus rhythm, no murmurs Lungs:  wheezing throughout lung fields   CLINICAL DATA:Short of breath for 1 day. Initial encounter. History of asthma.  EXAM: CHEST2 VIEW  COMPARISON:12/03/2013  FINDINGS: Heart, mediastinum and hila are unremarkable.  Lungs are mildly hyperexpanded. There is bronchial wall thickening most evident  centrally and in the medial lower lobes. This may all be chronic. A component of acute bronchitis is possible. There is no lung consolidation to suggest pneumonia. No pulmonary edema. No pleural effusion or pneumothorax.  Bony thorax is intact.  IMPRESSION: 1. Bronchial wall thickening most evident in the central lungs and medial lower lobes. This is similar to the prior study and may all be chronic. Acute bronchitis should be considered in the proper clinical setting. No pneumonia or pulmonary edema.   Electronically Signed By: Blanchie Serve M.D. On: 08/03/2014 11:54   Assessment and Plan Lillian was seen today for asthma.  Diagnoses and all orders for this visit:  Moderate persistent asthma with acute exacerbation -     albuterol (PROVENTIL) (2.5 MG/3ML) 0.083% nebulizer solution 2.5 mg -     ipratropium (ATROVENT) nebulizer solution 0.5 mg -     albuterol (PROVENTIL) (2.5 MG/3ML) 0.083% nebulizer solution 2.5 mg -     ipratropium (ATROVENT) nebulizer solution 0.5 mg -     methylPREDNISolone sodium succinate (SOLU-MEDROL) 125 mg/2 mL injection 125 mg  Hypoxia  Other orders -     Discontinue: predniSONE (STERAPRED UNI-PAK 21 TAB) 10 MG (21) TBPK tablet; Take 6 tablets (60 mg total) by mouth daily for 2 days, THEN 5 tablets (50 mg total) daily for 2 days, THEN 4 tablets (40 mg total) daily for 2 days, THEN 3 tablets (30 mg total) daily for 2 days, THEN 2 tablets (20 mg total) daily for 2 days, THEN 1 tablet (10 mg total) daily for 2 days. -     predniSONE (STERAPRED UNI-PAK 21 TAB) 10 MG (21) TBPK tablet; Take 6 tablets (60 mg total) by mouth daily for 2 days, THEN 5  tablets (50 mg total) daily for 2 days, THEN 4 tablets (40 mg total) daily for 2 days, THEN 3 tablets (30 mg total) daily for 2 days, THEN 2 tablets (20 mg total) daily for 2 days, THEN 1 tablet (10 mg total) daily for 2 days.   After one round of Duoneb pt still wheezing with shortness of breath Advised to follow up in 3 days with her PCP Improved with steroid and repeat duoneb Pt given prednisone taper and ER precautions Continue rescue albuterol   A total of 40 minutes were spent face-to-face with the patient during this encounter and over half of that time was spent on counseling and coordination of care.   [place]

## 2017-08-27 ENCOUNTER — Encounter: Payer: Medicare Other | Admitting: Family Medicine

## 2017-08-27 NOTE — Progress Notes (Deleted)
No chief complaint on file.   HPI   Pt was seen on 08/24/2017 for asthma exacerbation with hypoxia, wheezing and shortness of breath She was given duoneb x 2 and solumedrol in the office  She was also given prednisone and advised to continue her asthma medications She was advised close follow up to reassess her pulse oximetry  4 review of systems  Past Medical History:  Diagnosis Date  . Anemia, unspecified 03/17/2011  . Anxiety   . Aortic regurgitation 03/17/2011   Pt. denies problem with her heart.  . Asthma   . Blood clot in vein   . Complex regional pain syndrome   . CVA (cerebral infarction)   . Depression 03/17/2011  . Diverticulitis   . Diverticulosis   . Hyperlipidemia 03/17/2011  . Hypertension   . Mitral valve regurgitation 03/17/2011   Pt. said she never had problem with her heart  . Peripheral neuropathy 03/17/2011   Pt. denies this problem  . Polysubstance abuse ([place]) 03/17/2011   positive cocaine 08/07/10 er visit; States she has never had problems with drugs.  . Renal insufficiency syndrome   . Unspecified asthma(493.90) 03/17/2011    Current Outpatient Medications  Medication Sig Dispense Refill  . albuterol (PROVENTIL HFA;VENTOLIN HFA) 108 (90 BASE) MCG/ACT inhaler Inhale 2 puffs into the lungs every 4 (four) hours as needed for wheezing or shortness of breath. 1 Inhaler 0  . albuterol (PROVENTIL) (2.5 MG/3ML) 0.083% nebulizer solution Take 2.5 mg by nebulization every 6 (six) hours as needed for wheezing or shortness of breath.    Marland Kitchen amLODipine (NORVASC) 10 MG tablet Take 10 mg by mouth daily.      Marland Kitchen aspirin EC 81 MG tablet Take 81 mg by mouth daily.    . Azelastine-Fluticasone (DYMISTA NA) Place into the nose.    . cyclobenzaprine (FLEXERIL) 10 MG tablet Take 1 tablet (10 mg total) by mouth at bedtime as needed for muscle spasms. 20 tablet 0  . mometasone-formoterol (DULERA) 100-5 MCG/ACT AERO Inhale 2 puffs into the lungs 2 (two) times daily.    . montelukast  (SINGULAIR) 10 MG tablet Take 10 mg by mouth every morning.    . naproxen (NAPROSYN) 375 MG tablet Take 1 tablet (375 mg total) by mouth 2 (two) times daily. 20 tablet 0  . predniSONE (STERAPRED UNI-PAK 21 TAB) 10 MG (21) TBPK tablet Take 6 tablets (60 mg total) by mouth daily for 2 days, THEN 5 tablets (50 mg total) daily for 2 days, THEN 4 tablets (40 mg total) daily for 2 days, THEN 3 tablets (30 mg total) daily for 2 days, THEN 2 tablets (20 mg total) daily for 2 days, THEN 1 tablet (10 mg total) daily for 2 days. 42 tablet 0  . rosuvastatin (CRESTOR) 10 MG tablet Take 10 mg by mouth daily.       No current facility-administered medications for this visit.     Allergies:  Allergies  Allergen Reactions  . Cymbalta [Duloxetine Hcl] Other (See Comments)    Made me 'flip out'  . Klonopin [Clonazepam] Other (See Comments)    Makes me very drowsy  . Latex Rash    blisters    Past Surgical History:  Procedure Laterality Date  . CHOLECYSTECTOMY    . KNEE ARTHROSCOPY Right 1992  . ORTHOPEDIC SURGERY    . thumb surgery Right 2003   R thumb reconstruction    Social History   Socioeconomic History  . Marital status: Divorced  Spouse name: Not on file  . Number of children: Not on file  . Years of education: Not on file  . Highest education level: Not on file  Occupational History  . Not on file  Social Needs  . Financial resource strain: Not on file  . Food insecurity:    Worry: Not on file    Inability: Not on file  . Transportation needs:    Medical: Not on file    Non-medical: Not on file  Tobacco Use  . Smoking status: Former Smoker    Types: Cigars  . Smokeless tobacco: Never Used  Substance and Sexual Activity  . Alcohol use: Yes    Comment: occasional beer  . Drug use: No    Comment: THC & cocaine + @ 08-07-10 ER visit  . Sexual activity: Yes    Birth control/protection: None  Lifestyle  . Physical activity:    Days per week: Not on file    Minutes per  session: Not on file  . Stress: Not on file  Relationships  . Social connections:    Talks on phone: Not on file    Gets together: Not on file    Attends religious service: Not on file    Active member of club or organization: Not on file    Attends meetings of clubs or organizations: Not on file    Relationship status: Not on file  Other Topics Concern  . Not on file  Social History Narrative  . Not on file    Family History  Problem Relation Age of Onset  . Diabetes Mother   . Hypertension Mother   . Prostate cancer Father   . Hypertension Father   . Diabetes Father   . Breast cancer Paternal Aunt        x 2, ? ages of onset  . Breast cancer Maternal Aunt 60  . Breast cancer Maternal Aunt 75     ROS Review of Systems See HPI Constitution: No fevers or chills No malaise No diaphoresis Skin: No rash or itching Eyes: no blurry vision, no double vision GU: no dysuria or hematuria Neuro: no dizziness or headaches ***all others reviewed and negative   Objective: There were no vitals filed for this visit.  Physical Exam  Assessment and Plan There are no diagnoses linked to this encounter.   [place]

## 2017-08-27 NOTE — Progress Notes (Signed)
No show

## 2017-11-04 ENCOUNTER — Encounter (HOSPITAL_COMMUNITY): Payer: Self-pay

## 2017-11-04 ENCOUNTER — Other Ambulatory Visit: Payer: Self-pay

## 2017-11-04 ENCOUNTER — Ambulatory Visit (HOSPITAL_COMMUNITY)
Admission: EM | Admit: 2017-11-04 | Discharge: 2017-11-04 | Disposition: A | Discharge status: Home / Self Care | Payer: Medicare Other | Attending: Family Medicine | Admitting: Family Medicine

## 2017-11-04 DIAGNOSIS — J4521 Mild intermittent asthma with (acute) exacerbation: Secondary | ICD-10-CM

## 2017-11-04 DIAGNOSIS — J40 Bronchitis, not specified as acute or chronic: Secondary | ICD-10-CM

## 2017-11-04 MED ORDER — ALBUTEROL SULFATE HFA 108 (90 BASE) MCG/ACT IN AERS: 2.0000 | INHALATION_SPRAY | RESPIRATORY_TRACT | 0 refills | Status: DC | PRN

## 2017-11-04 MED ORDER — PREDNISONE 20 MG PO TABS: 40.0000 mg | ORAL_TABLET | Freq: Every day | ORAL | 0 refills | Status: AC

## 2017-11-04 MED ORDER — AMOXICILLIN-POT CLAVULANATE 875-125 MG PO TABS: 1.0000 | ORAL_TABLET | Freq: Two times a day (BID) | ORAL | 0 refills | Status: DC

## 2017-11-04 NOTE — ED Provider Notes (Signed)
[facility]    CSN: [id number] Arrival date & time: 11/04/17  1624     History   Chief Complaint Chief Complaint  Patient presents with  . Cough    HPI AGUSTINA WITZKE is a 57 y.o. female.   HPI ARYNN ARMAND is a 57 y.o. female who complains of congestion, sneezing, cough non-productive, chest tightness, and sore throat for 3 days. She  has a history of asthma although no recent exacerbations. She has had shortness of breath mostly at night and worsen with cough. She has attempted relief with OTC medication without any relief. Past Medical History:  Diagnosis Date  . Anemia, unspecified 03/17/2011  . Anxiety   . Aortic regurgitation 03/17/2011   Pt. denies problem with her heart.  . Asthma   . Blood clot in vein   . Complex regional pain syndrome   . CVA (cerebral infarction)   . Depression 03/17/2011  . Diverticulitis   . Diverticulosis   . Hyperlipidemia 03/17/2011  . Hypertension   . Mitral valve regurgitation 03/17/2011   Pt. said she never had problem with her heart  . Peripheral neuropathy 03/17/2011   Pt. denies this problem  . Polysubstance abuse ([place]) 03/17/2011   positive cocaine 08/07/10 er visit; States she has never had problems with drugs.  . Renal insufficiency syndrome   . Unspecified asthma(493.90) 03/17/2011    Patient Active Problem List   Diagnosis Date Noted  . CVA (cerebral infarction) 03/17/2011  . Unspecified asthma(493.90) 03/17/2011  . Preventative health care 03/17/2011  . Polysubstance abuse ([place]) 03/17/2011  . Hyperlipidemia 03/17/2011  . Anemia, unspecified 03/17/2011  . Aortic regurgitation 03/17/2011  . Mitral valve regurgitation 03/17/2011  . Depression 03/17/2011  . Peripheral neuropathy 03/17/2011  . Hypertension   . Anxiety     Past Surgical History:  Procedure Laterality Date  . CHOLECYSTECTOMY    . KNEE ARTHROSCOPY Right 1992  . ORTHOPEDIC SURGERY    . thumb surgery Right 2003   R thumb reconstruction     OB History   None      Home Medications    Prior to Admission medications   Medication Sig Start Date End Date Taking? Authorizing Provider  albuterol (PROVENTIL HFA;VENTOLIN HFA) 108 (90 Base) MCG/ACT inhaler Inhale 2 puffs into the lungs every 4 (four) hours as needed for wheezing or shortness of breath. 11/04/17   Scot Jun, FNP  amLODipine (NORVASC) 10 MG tablet Take 10 mg by mouth daily.      [provider]  amoxicillin-clavulanate (AUGMENTIN) 875-125 MG tablet Take 1 tablet by mouth 2 (two) times daily. 11/04/17   Scot Jun, FNP  aspirin EC 81 MG tablet Take 81 mg by mouth daily.    [provider]  Azelastine-Fluticasone (DYMISTA NA) Place into the nose.    [provider]  cyclobenzaprine (FLEXERIL) 10 MG tablet Take 1 tablet (10 mg total) by mouth at bedtime as needed for muscle spasms. 08/23/17   Wurst, [place], PA-C  mometasone-formoterol (DULERA) 100-5 MCG/ACT AERO Inhale 2 puffs into the lungs 2 (two) times daily.    [provider]  montelukast (SINGULAIR) 10 MG tablet Take 10 mg by mouth every morning.    [provider]  naproxen (NAPROSYN) 375 MG tablet Take 1 tablet (375 mg total) by mouth 2 (two) times daily. 08/23/17   Wurst, [place], PA-C  predniSONE (DELTASONE) 20 MG tablet Take 2 tablets (40 mg total) by mouth daily with breakfast for  5 days. 11/04/17 11/09/17  Scot Jun, FNP  rosuvastatin (CRESTOR) 10 MG tablet Take 10 mg by mouth daily.      [provider]    Family History Family History  Problem Relation Age of Onset  . Diabetes Mother   . Hypertension Mother   . Prostate cancer Father   . Hypertension Father   . Diabetes Father   . Breast cancer Paternal Aunt        x 2, ? ages of onset  . Breast cancer Maternal Aunt 60  . Breast cancer Maternal Aunt 38    Social History Social History   Tobacco Use  . Smoking status: Former Smoker    Types: Cigars  . Smokeless  tobacco: Never Used  Substance Use Topics  . Alcohol use: Yes    Comment: occasional beer  . Drug use: No    Comment: THC & cocaine + @ 08-07-10 ER visit     Allergies   Cymbalta [duloxetine hcl]; Klonopin [clonazepam]; and Latex   Review of Systems Review of Systems Pertinent negatives listed in HPI  Physical Exam Triage Vital Signs ED Triage Vitals [11/04/17 1700]  Enc Vitals Group     BP (!) 149/99     Pulse Rate 100     Resp 18     Temp 97.8 F (36.6 C)     Temp Source Oral     SpO2 100 %     Weight 223 lb (101.2 kg)     Height      Head Circumference      Peak Flow      Pain Score 6     Pain Loc      Pain Edu?      Excl. in [place]?    No data found.  Updated Vital Signs BP (!) 149/99 (BP Location: Right Arm)   Pulse 100   Temp 97.8 F (36.6 C) (Oral)   Resp 18   Wt 223 lb (101.2 kg)   SpO2 100%   BMI 33.41 kg/m   Visual Acuity Right Eye Distance:   Left Eye Distance:   Bilateral Distance:    Right Eye Near:   Left Eye Near:    Bilateral Near:     Physical Exam General Appearance:    Alert, cooperative, ill-appearing, non distress  HENT:   bilateral TM normal without fluid or infection, neck without nodes, throat normal without erythema or exudate, post nasal drip noted, nasal mucosa congested, nares erythematous and edematous  Eyes:    PERRL, conjunctiva/corneas clear, EOM's intact       Lungs:     Diminished lung sounds. Effort normal. No wheezing or rales.  Dry persistent cough noted throughout exam  Heart:    Regular rate and rhythm  Neurologic:   Awake, alert, oriented x 3. No apparent focal neurological           defect.    UC Treatments / Results  Labs (all labs ordered are listed, but only abnormal results are displayed) Labs Reviewed - No data to display  EKG None  Radiology No results found.  Procedures Procedures (including critical care time)  Medications Ordered in UC Medications - No data to display  Initial Impression  / Assessment and Plan / UC Course  I have reviewed the triage vital signs and the nursing notes.  Pertinent labs & imaging results that were available during my care of the patient were reviewed by me and considered in  my medical decision making (see chart for details).   Patient presents today ill-appearing, with symptoms consistent with likely bronchitis and a mild asthma exacerbation.  She is nontoxic, afebrile,  and without respiratory distress. Will start on empiric antibiotics as well as short course of prednisone, and refill albuterol.  Advised to follow-up with PCP if symptoms do not improve quickly resolved.  Patient was in agreement with plan and verbalized understanding.       Final Clinical Impressions(s) / UC Diagnoses   Final diagnoses:  Bronchitis  Mild intermittent asthma with exacerbation   Discharge Instructions   None    ED Prescriptions    Medication Sig Dispense Auth. Provider   albuterol (PROVENTIL HFA;VENTOLIN HFA) 108 (90 Base) MCG/ACT inhaler  (Status: Discontinued) Inhale 2 puffs into the lungs every 4 (four) hours as needed for wheezing or shortness of breath. 1 Inhaler Scot Jun, FNP   predniSONE (DELTASONE) 20 MG tablet Take 2 tablets (40 mg total) by mouth daily with breakfast for 5 days. 10 tablet Scot Jun, FNP   amoxicillin-clavulanate (AUGMENTIN) 875-125 MG tablet Take 1 tablet by mouth 2 (two) times daily. 20 tablet Scot Jun, FNP   albuterol (PROVENTIL HFA;VENTOLIN HFA) 108 (90 Base) MCG/ACT inhaler Inhale 2 puffs into the lungs every 4 (four) hours as needed for wheezing or shortness of breath. [place], FNP     Controlled Substance Prescriptions [place] Controlled Substance Registry consulted? Not Applicable   Scot Jun, [place] 11/06/17 [phone]

## 2017-11-04 NOTE — ED Triage Notes (Signed)
Pt c/o flu symptoms cold, cough and sore throat. X 3 days

## 2018-02-20 ENCOUNTER — Emergency Department (HOSPITAL_COMMUNITY): Payer: Medicare Other

## 2018-02-20 ENCOUNTER — Observation Stay (HOSPITAL_COMMUNITY)
Admission: EM | Admit: 2018-02-20 | Discharge: 2018-02-21 | Disposition: A | Discharge status: Home / Self Care | Payer: Medicare Other | Attending: Internal Medicine | Admitting: Internal Medicine

## 2018-02-20 ENCOUNTER — Encounter (HOSPITAL_COMMUNITY): Payer: Self-pay

## 2018-02-20 ENCOUNTER — Other Ambulatory Visit: Payer: Self-pay

## 2018-02-20 DIAGNOSIS — J441 Chronic obstructive pulmonary disease with (acute) exacerbation: Secondary | ICD-10-CM | POA: Insufficient documentation

## 2018-02-20 DIAGNOSIS — J45901 Unspecified asthma with (acute) exacerbation: Secondary | ICD-10-CM | POA: Diagnosis not present

## 2018-02-20 DIAGNOSIS — Z8249 Family history of ischemic heart disease and other diseases of the circulatory system: Secondary | ICD-10-CM | POA: Insufficient documentation

## 2018-02-20 DIAGNOSIS — F419 Anxiety disorder, unspecified: Secondary | ICD-10-CM | POA: Diagnosis not present

## 2018-02-20 DIAGNOSIS — Z79899 Other long term (current) drug therapy: Secondary | ICD-10-CM | POA: Diagnosis not present

## 2018-02-20 DIAGNOSIS — Z888 Allergy status to other drugs, medicaments and biological substances status: Secondary | ICD-10-CM | POA: Diagnosis not present

## 2018-02-20 DIAGNOSIS — D649 Anemia, unspecified: Secondary | ICD-10-CM | POA: Diagnosis present

## 2018-02-20 DIAGNOSIS — Z7982 Long term (current) use of aspirin: Secondary | ICD-10-CM | POA: Diagnosis not present

## 2018-02-20 DIAGNOSIS — Z87891 Personal history of nicotine dependence: Secondary | ICD-10-CM | POA: Insufficient documentation

## 2018-02-20 DIAGNOSIS — I10 Essential (primary) hypertension: Secondary | ICD-10-CM | POA: Diagnosis not present

## 2018-02-20 DIAGNOSIS — I2721 Secondary pulmonary arterial hypertension: Secondary | ICD-10-CM | POA: Diagnosis not present

## 2018-02-20 DIAGNOSIS — E785 Hyperlipidemia, unspecified: Secondary | ICD-10-CM | POA: Insufficient documentation

## 2018-02-20 DIAGNOSIS — Z8673 Personal history of transient ischemic attack (TIA), and cerebral infarction without residual deficits: Secondary | ICD-10-CM | POA: Insufficient documentation

## 2018-02-20 DIAGNOSIS — Z91040 Latex allergy status: Secondary | ICD-10-CM | POA: Insufficient documentation

## 2018-02-20 DIAGNOSIS — R0902 Hypoxemia: Secondary | ICD-10-CM | POA: Diagnosis not present

## 2018-02-20 LAB — INFLUENZA PANEL BY PCR (TYPE A & B)
Influenza A By PCR: NEGATIVE
Influenza B By PCR: NEGATIVE

## 2018-02-20 LAB — CBC WITH DIFFERENTIAL/PLATELET
Abs Immature Granulocytes: 0.11 10*3/uL — ABNORMAL HIGH (ref 0.00–0.07)
Basophils Absolute: 0 10*3/uL (ref 0.0–0.1)
Basophils Relative: 0 %
Eosinophils Absolute: 0.2 10*3/uL (ref 0.0–0.5)
Eosinophils Relative: 2 %
HCT: 40.5 % (ref 36.0–46.0)
Hemoglobin: 12.2 g/dL (ref 12.0–15.0)
Immature Granulocytes: 1 %
Lymphocytes Relative: 22 %
Lymphs Abs: 2.6 10*3/uL (ref 0.7–4.0)
MCH: 26 pg (ref 26.0–34.0)
MCHC: 30.1 g/dL (ref 30.0–36.0)
MCV: 86.4 fL (ref 80.0–100.0)
Monocytes Absolute: 0.9 10*3/uL (ref 0.1–1.0)
Monocytes Relative: 8 %
Neutro Abs: 8 10*3/uL — ABNORMAL HIGH (ref 1.7–7.7)
Neutrophils Relative %: 67 %
Platelets: 293 10*3/uL (ref 150–400)
RBC: 4.69 MIL/uL (ref 3.87–5.11)
RDW: 15.9 % — ABNORMAL HIGH (ref 11.5–15.5)
WBC: 11.9 10*3/uL — ABNORMAL HIGH (ref 4.0–10.5)
nRBC: 0 % (ref 0.0–0.2)

## 2018-02-20 LAB — BASIC METABOLIC PANEL
Anion gap: 7 (ref 5–15)
BUN: 11 mg/dL (ref 6–20)
CO2: 28 mmol/L (ref 22–32)
Calcium: 8.8 mg/dL — ABNORMAL LOW (ref 8.9–10.3)
Chloride: 104 mmol/L (ref 98–111)
Creatinine, Ser: 1.16 mg/dL — ABNORMAL HIGH (ref 0.44–1.00)
GFR calc Af Amer: >60 mL/min (ref >60)
GFR calc non Af Amer: 53 mL/min — ABNORMAL LOW (ref >60)
Glucose, Bld: 117 mg/dL — ABNORMAL HIGH (ref 70–99)
Potassium: 3.6 mmol/L (ref 3.5–5.1)
Sodium: 139 mmol/L (ref 135–145)

## 2018-02-20 LAB — I-STAT TROPONIN, ED: Troponin i, poc: 0 ng/mL (ref 0.00–0.08)

## 2018-02-20 LAB — POC URINE PREG, ED: Preg Test, Ur: NEGATIVE

## 2018-02-20 LAB — GROUP A STREP BY PCR: Group A Strep by PCR: NOT DETECTED

## 2018-02-20 MED ORDER — SODIUM CHLORIDE (PF) 0.9 % IJ SOLN
INTRAMUSCULAR | Status: AC
  Filled 2018-02-20: qty 50

## 2018-02-20 MED ORDER — ASPIRIN EC 81 MG PO TBEC
81.0000 mg | DELAYED_RELEASE_TABLET | Freq: Every day | ORAL | Status: DC
  Administered 2018-02-20 – 2018-02-21 (×2): 81 mg via ORAL
  Filled 2018-02-20 (×2): qty 1

## 2018-02-20 MED ORDER — ROSUVASTATIN CALCIUM 10 MG PO TABS
10.0000 mg | ORAL_TABLET | Freq: Every day | ORAL | Status: DC
  Administered 2018-02-20 – 2018-02-21 (×2): 10 mg via ORAL
  Filled 2018-02-20 (×2): qty 1

## 2018-02-20 MED ORDER — LEVOFLOXACIN IN D5W 500 MG/100ML IV SOLN
500.0000 mg | INTRAVENOUS | Status: DC
  Administered 2018-02-20: 500 mg via INTRAVENOUS
  Filled 2018-02-20: qty 100

## 2018-02-20 MED ORDER — PREDNISONE 20 MG PO TABS
40.0000 mg | ORAL_TABLET | Freq: Every day | ORAL | Status: DC
  Administered 2018-02-20 – 2018-02-21 (×2): 40 mg via ORAL
  Filled 2018-02-20 (×2): qty 2

## 2018-02-20 MED ORDER — ONDANSETRON 4 MG PO TBDP
4.0000 mg | ORAL_TABLET | Freq: Once | ORAL | Status: AC | PRN
  Administered 2018-02-20: 4 mg via ORAL
  Filled 2018-02-20: qty 1

## 2018-02-20 MED ORDER — IPRATROPIUM-ALBUTEROL 0.5-2.5 (3) MG/3ML IN SOLN
3.0000 mL | Freq: Once | RESPIRATORY_TRACT | Status: AC
  Administered 2018-02-20: 3 mL via RESPIRATORY_TRACT
  Filled 2018-02-20: qty 3

## 2018-02-20 MED ORDER — ACETAMINOPHEN 325 MG PO TABS
650.0000 mg | ORAL_TABLET | Freq: Once | ORAL | Status: AC
  Administered 2018-02-20: 650 mg via ORAL
  Filled 2018-02-20: qty 2

## 2018-02-20 MED ORDER — ENOXAPARIN SODIUM 40 MG/0.4ML ~~LOC~~ SOLN
40.0000 mg | SUBCUTANEOUS | Status: DC
  Filled 2018-02-20: qty 0.4

## 2018-02-20 MED ORDER — AMLODIPINE BESYLATE 10 MG PO TABS
10.0000 mg | ORAL_TABLET | Freq: Every day | ORAL | Status: DC
  Administered 2018-02-20 – 2018-02-21 (×2): 10 mg via ORAL
  Filled 2018-02-20: qty 2
  Filled 2018-02-20: qty 1

## 2018-02-20 MED ORDER — SODIUM CHLORIDE 0.9 % IV BOLUS
1000.0000 mL | Freq: Once | INTRAVENOUS | Status: AC
  Administered 2018-02-20: 1000 mL via INTRAVENOUS

## 2018-02-20 MED ORDER — IOPAMIDOL (ISOVUE-370) INJECTION 76%
100.0000 mL | Freq: Once | INTRAVENOUS | Status: AC | PRN
  Administered 2018-02-20: 100 mL via INTRAVENOUS

## 2018-02-20 MED ORDER — LEVALBUTEROL HCL 1.25 MG/0.5ML IN NEBU
1.2500 mg | INHALATION_SOLUTION | Freq: Four times a day (QID) | RESPIRATORY_TRACT | Status: DC
  Administered 2018-02-20 – 2018-02-21 (×4): 1.25 mg via RESPIRATORY_TRACT
  Filled 2018-02-20 (×4): qty 0.5

## 2018-02-20 MED ORDER — ALBUTEROL (5 MG/ML) CONTINUOUS INHALATION SOLN
10.0000 mg/h | INHALATION_SOLUTION | Freq: Once | RESPIRATORY_TRACT | Status: AC
  Administered 2018-02-20: 10 mg/h via RESPIRATORY_TRACT
  Filled 2018-02-20: qty 20

## 2018-02-20 MED ORDER — GUAIFENESIN-DM 100-10 MG/5ML PO SYRP
5.0000 mL | ORAL_SOLUTION | ORAL | Status: DC | PRN
  Administered 2018-02-20 (×2): 5 mL via ORAL
  Filled 2018-02-20 (×2): qty 10

## 2018-02-20 MED ORDER — IPRATROPIUM BROMIDE 0.02 % IN SOLN
0.5000 mg | Freq: Four times a day (QID) | RESPIRATORY_TRACT | Status: DC
  Administered 2018-02-20 – 2018-02-21 (×4): 0.5 mg via RESPIRATORY_TRACT
  Filled 2018-02-20 (×4): qty 2.5

## 2018-02-20 MED ORDER — OSELTAMIVIR PHOSPHATE 75 MG PO CAPS
75.0000 mg | ORAL_CAPSULE | Freq: Two times a day (BID) | ORAL | Status: DC
  Administered 2018-02-20: 75 mg via ORAL
  Filled 2018-02-20 (×2): qty 1

## 2018-02-20 MED ORDER — ACETAMINOPHEN 325 MG PO TABS
650.0000 mg | ORAL_TABLET | Freq: Four times a day (QID) | ORAL | Status: DC | PRN
  Administered 2018-02-20 – 2018-02-21 (×2): 650 mg via ORAL
  Filled 2018-02-20 (×2): qty 2

## 2018-02-20 MED ORDER — IOPAMIDOL (ISOVUE-370) INJECTION 76%
INTRAVENOUS | Status: AC
  Filled 2018-02-20: qty 100

## 2018-02-20 MED ORDER — SODIUM CHLORIDE 0.9 % IV SOLN
INTRAVENOUS | Status: AC
  Administered 2018-02-20: 16:00:00 via INTRAVENOUS

## 2018-02-20 NOTE — ED Notes (Signed)
Pt aware that urine sample is needed. Unable to provide right now  

## 2018-02-20 NOTE — Progress Notes (Signed)
Pt arrived to the floor and found to have a temperature. RRN and MD assessed pt at the bedside and placed and the orders to be carried out. Will continue to monitor

## 2018-02-20 NOTE — ED Notes (Signed)
Patient transported to X-ray 

## 2018-02-20 NOTE — ED Notes (Signed)
Pt placed on hospital bed

## 2018-02-20 NOTE — ED Triage Notes (Signed)
Pt arrived stating over the last three days she has sore throat, body aches, congested, nauseated and a cough.

## 2018-02-20 NOTE — H&P (Signed)
History and Physical    TAIMI TOWE FXT:[id number] DOB: [birth year] DOA: 02/20/2018  I have briefly reviewed the patient's prior medical records in [place]  PCP: [facility]  Patient coming from: home  Chief Complaint: shortness of breath   HPI: Judith Mccullough is a 58 y.o. female with medical history significant of asthma, prior tobacco use quit 15 years ago, hypertension, hyperlipidemia, who presents to the hospital with chief complaint of flulike illness progressively getting worse over the last 2 to 3 days.  She is having several sick contacts, including her husband who actually tested positive for influenza.  She is complaining of runny nose, congestion, sore throat and a dry cough.  She was complaining of mild intermittent shortness of breath.  She denies hearing any wheezing at home.  She has been using her home albuterol with minimal relief and decided to come to the emergency room.  She denies any chest pain, she denies abdominal pain, no nausea or vomiting.  No lightheadedness or dizziness.  ED Course: In the ED she has a low-grade temp of 99.4, slightly tachypneic with respiratory rate in the 20s as well as tachycardic with a heart rate in the 120s.  Her oxygen sats fluctuates as low as 85% on room air.  She was given an hour-long neb, improved some but she is still desatting and we are asked to admit.  EDP reports wheezing on arrival which is now improved  Review of Systems: As per HPI otherwise 10 point review of systems negative.   Past Medical History:  Diagnosis Date  . Anemia, unspecified 03/17/2011  . Anxiety   . Aortic regurgitation 03/17/2011   Pt. denies problem with her heart.  . Asthma   . Blood clot in vein   . Complex regional pain syndrome   . CVA (cerebral infarction)   . Depression 03/17/2011  . Diverticulitis   . Diverticulosis   . Hyperlipidemia 03/17/2011  . Hypertension   . Mitral valve regurgitation 03/17/2011    Pt. said she never had problem with her heart  . Peripheral neuropathy 03/17/2011   Pt. denies this problem  . Polysubstance abuse ([place]) 03/17/2011   positive cocaine 08/07/10 er visit; States she has never had problems with drugs.  . Renal insufficiency syndrome   . Unspecified asthma(493.90) 03/17/2011    Past Surgical History:  Procedure Laterality Date  . CHOLECYSTECTOMY    . KNEE ARTHROSCOPY Right 1992  . ORTHOPEDIC SURGERY    . thumb surgery Right 2003   R thumb reconstruction     reports that she has quit smoking. Her smoking use included cigars. She has never used smokeless tobacco. She reports current alcohol use. She reports that she does not use drugs.  Allergies  Allergen Reactions  . Cymbalta [Duloxetine Hcl] Other (See Comments)    Made me 'flip out'  . Klonopin [Clonazepam] Other (See Comments)    Makes me very drowsy  . Latex Rash    blisters    Family History  Problem Relation Age of Onset  . Diabetes Mother   . Hypertension Mother   . Prostate cancer Father   . Hypertension Father   . Diabetes Father   . Breast cancer Paternal Aunt        x 2, ? ages of onset  . Breast cancer Maternal Aunt 60  . Breast cancer Maternal Aunt 38    Prior to Admission medications   Medication Sig Start Date End  Date Taking? Authorizing Provider  albuterol (PROVENTIL HFA;VENTOLIN HFA) 108 (90 Base) MCG/ACT inhaler Inhale 2 puffs into the lungs every 4 (four) hours as needed for wheezing or shortness of breath. 11/04/17  Yes Scot Jun, FNP  amLODipine (NORVASC) 10 MG tablet Take 10 mg by mouth daily.     Yes [provider]  aspirin EC 81 MG tablet Take 81 mg by mouth daily.   Yes [provider]  DYMISTA 137-50 MCG/ACT SUSP Place 1 spray into both nostrils 2 (two) times daily. 09/18/17  Yes [provider]  mometasone-formoterol (DULERA) 100-5 MCG/ACT AERO Inhale 2 puffs into the lungs 2 (two) times daily.   Yes [provider]   montelukast (SINGULAIR) 10 MG tablet Take 10 mg by mouth every morning.   Yes [provider]  rosuvastatin (CRESTOR) 10 MG tablet Take 10 mg by mouth daily.     Yes [provider]  amoxicillin-clavulanate (AUGMENTIN) 875-125 MG tablet Take 1 tablet by mouth 2 (two) times daily. Patient not taking: Reported on 02/20/2018 11/04/17   Scot Jun, FNP  cyclobenzaprine (FLEXERIL) 10 MG tablet Take 1 tablet (10 mg total) by mouth at bedtime as needed for muscle spasms. Patient not taking: Reported on 02/20/2018 08/23/17   [place], [place], PA-C  naproxen (NAPROSYN) 375 MG tablet Take 1 tablet (375 mg total) by mouth 2 (two) times daily. Patient not taking: Reported on 02/20/2018 08/23/17   Lestine Box, [place]    Physical Exam: Vitals:   02/20/18 0930 02/20/18 1011 02/20/18 1030 02/20/18 1045  BP: 105/65 137/84 (!) 129/92   Pulse: (!) 117 (!) 115 (!) 119 (!) 118  Resp:  18 17 (!) 22  Temp:      TempSrc:      SpO2: 91% 96% 96% 91%  Weight:      Height:        Constitutional: NAD, calm, slightly restless Eyes: PERRL, lids and conjunctivae normal ENMT: Mucous membranes are moist.  Neck: normal, supple Respiratory: clear to auscultation bilaterally, no wheezing, no crackles. Normal respiratory effort. No accessory muscle use.  Cardiovascular: Regular rate and rhythm, no murmurs / rubs / gallops. No extremity edema.  Tachycardic Abdomen: no tenderness. Bowel sounds positive.  Musculoskeletal: no clubbing / cyanosis.  Skin: no rashes appreciated Neurologic: Moves all 4 spontaneously, ambulatory, nonfocal Psychiatric: Normal judgment and insight. Alert and oriented x 3. Normal mood.   Labs on Admission: I have personally reviewed following labs and imaging studies  CBC: Recent Labs  Lab 02/20/18 0902  WBC 11.9*  NEUTROABS 8.0*  HGB 12.2  HCT 40.5  MCV 86.4  PLT 856   Basic Metabolic Panel: Recent Labs  Lab 02/20/18 0902  NA 139  K 3.6  CL 104  CO2 28   GLUCOSE 117*  BUN 11  CREATININE 1.16*  CALCIUM 8.8*   GFR: Estimated Creatinine Clearance: 67.6 mL/min (A) (by C-G formula based on SCr of 1.16 mg/dL (H)). Liver Function Tests: No results for input(s): AST, ALT, ALKPHOS, BILITOT, PROT, ALBUMIN in the last 168 hours. No results for input(s): LIPASE, AMYLASE in the last 168 hours. No results for input(s): AMMONIA in the last 168 hours. Coagulation Profile: No results for input(s): INR, PROTIME in the last 168 hours. Cardiac Enzymes: No results for input(s): CKTOTAL, CKMB, CKMBINDEX, TROPONINI in the last 168 hours. BNP (last 3 results) No results for input(s): PROBNP in the last 8760 hours. HbA1C: No results for input(s): HGBA1C in the last 72 hours.  CBG: No results for input(s): GLUCAP in the last 168 hours. Lipid Profile: No results for input(s): CHOL, HDL, LDLCALC, TRIG, CHOLHDL, LDLDIRECT in the last 72 hours. Thyroid Function Tests: No results for input(s): TSH, T4TOTAL, FREET4, T3FREE, THYROIDAB in the last 72 hours. Anemia Panel: No results for input(s): VITAMINB12, FOLATE, FERRITIN, TIBC, IRON, RETICCTPCT in the last 72 hours. Urine analysis:    Component Value Date/Time   COLORURINE YELLOW 09/19/2016 1320   APPEARANCEUR HAZY (A) 09/19/2016 1320   LABSPEC 1.024 09/19/2016 1320   PHURINE 5.0 09/19/2016 1320   GLUCOSEU NEGATIVE 09/19/2016 1320   HGBUR NEGATIVE 09/19/2016 1320   BILIRUBINUR NEGATIVE 09/19/2016 1320   KETONESUR NEGATIVE 09/19/2016 1320   PROTEINUR 30 (A) 09/19/2016 1320   UROBILINOGEN 0.2 07/14/2014 2034   NITRITE NEGATIVE 09/19/2016 1320   LEUKOCYTESUR SMALL (A) 09/19/2016 1320     Radiological Exams on Admission: Dg Chest 2 View  Result Date: 02/20/2018 CLINICAL DATA:  Cough and fever EXAM: CHEST - 2 VIEW COMPARISON:  October 18, 2016 FINDINGS: There is no edema or consolidation. The heart size and pulmonary vascularity are normal. No adenopathy. No bone lesions. IMPRESSION: No edema or  consolidation. Electronically Signed   By: Lowella Grip III M.D.   On: 02/20/2018 07:09   Ct Angio Chest Pe W And/or Wo Contrast  Result Date: 02/20/2018 CLINICAL DATA:  Cough and shortness of breath EXAM: CT ANGIOGRAPHY CHEST WITH CONTRAST TECHNIQUE: Multidetector CT imaging of the chest was performed using the standard protocol during bolus administration of intravenous contrast. Multiplanar CT image reconstructions and MIPs were obtained to evaluate the vascular anatomy. CONTRAST:  148mL ISOVUE-370 IOPAMIDOL (ISOVUE-370) INJECTION 76% COMPARISON:  Chest CT angiogram August 03, 2014; chest radiograph February 20, 2018 FINDINGS: Cardiovascular: There is no demonstrable pulmonary embolus. There is no appreciable thoracic aortic aneurysm or dissection. There are occasional foci of calcification in proximal visualized great vessels. Visualized great vessels otherwise appear unremarkable. There is no pericardial effusion or pericardial thickening. The main pulmonary outflow tract measures 3.1 cm in diameter, prominent. Mediastinum/Nodes: Visualized thyroid appears unremarkable. There are occasional subcentimeter mediastinal lymph nodes. No adenopathy is seen in the thoracic region by size criteria. There is a small hiatal hernia. Lungs/Pleura: There is underlying centrilobular emphysematous change. There is localized atelectasis in the medial segment of the right middle lobe with a suspected small focus of peripheral mucous plugging. There is no frank edema or consolidation. No pleural effusion or pleural thickening evident. Upper Abdomen: Gallbladder is absent. Visualized upper abdominal structures otherwise appear unremarkable. Musculoskeletal: There is degenerative change in the lower thoracic region. There are no blastic or lytic bone lesions. There is a probable sebaceous cyst arising in the medial left breast region measuring 2.2 x 1.8 cm, also present on prior study. Review of the MIP images confirms the  above findings. IMPRESSION: 1. No demonstrable pulmonary embolus. No thoracic aortic aneurysm or dissection. 2. Prominence of the main pulmonary outflow tract, a finding felt to be indicative of a degree of pulmonary arterial hypertension. 3. Underlying emphysematous change. No edema or consolidation. Probable mild atelectatic change with mucous plugging in the medial segment right middle lobe. 4.  Small hiatal hernia. 5.  No demonstrable adenopathy. 6.  Gallbladder absent. Emphysema (ICD10-J43.9). Electronically Signed   By: Lowella Grip III M.D.   On: 02/20/2018 10:37    EKG: Telemetry shows sinus rhythm  Assessment/Plan Active Problems:   Asthma exacerbation   Principal Problem Asthma exacerbation -Appears mild, wheezing has resolved  on my evaluation.  She is quite tachycardic but this is likely due to an hour-long neb. -Placed on Xopenex/Atrovent, given reported wheezing on admission will start prednisone, chest x-ray and CTA without infiltrates and will hold antibiotics at this is likely viral -CT scan also shows some underlying emphysematous changes,?  COPD/emphysema overlap given history of tobacco use  Active Problems HTN -Resume home medications  Hyperlipidemia -Resume home statin  History of CVA -Continue aspirin   DVT prophylaxis: Lovenox Code Status: Full code Family Communication: Husband present at bedside Disposition Plan: Admit to [place], Home when ready Consults called: None   Marzetta Board, MD, PhD [facility]  Contact via [email or web address]  [facility] Office Info P: [phone]  F: [phone]   02/20/2018, 11:05 AM

## 2018-02-20 NOTE — ED Notes (Signed)
Respiratory notified need for neb

## 2018-02-20 NOTE — ED Notes (Signed)
ED TO INPATIENT HANDOFF REPORT  Name/Age/Gender Judith Mccullough 58 y.o. female  Code Status    Code Status Orders  (From admission, onward)         Start     Ordered   02/20/18 1354  Full code  Continuous     02/20/18 1354        Code Status History    This patient has a current code status but no historical code status.      Home/SNF/Other Given to floor  Chief Complaint Flu Symptoms  Level of Care/Admitting Diagnosis ED Disposition    ED Disposition Condition [facility]: [facility] [100102]  Level of Care: Med-Surg [16]  Diagnosis: Asthma exacerbation [161096]  Admitting Physician: Caren Griffins [[id number]]  Attending Physician: Caren Griffins [[id number]]  PT Class (Do Not Modify): Observation [104]  PT Acc Code (Do Not Modify): Observation [10022]       Medical History Past Medical History:  Diagnosis Date  . Anemia, unspecified 03/17/2011  . Anxiety   . Aortic regurgitation 03/17/2011   Pt. denies problem with her heart.  . Asthma   . Blood clot in vein   . Complex regional pain syndrome   . CVA (cerebral infarction)   . Depression 03/17/2011  . Diverticulitis   . Diverticulosis   . Hyperlipidemia 03/17/2011  . Hypertension   . Mitral valve regurgitation 03/17/2011   Pt. said she never had problem with her heart  . Peripheral neuropathy 03/17/2011   Pt. denies this problem  . Polysubstance abuse ([place]) 03/17/2011   positive cocaine 08/07/10 er visit; States she has never had problems with drugs.  . Renal insufficiency syndrome   . Unspecified asthma(493.90) 03/17/2011    Allergies Allergies  Allergen Reactions  . Cymbalta [Duloxetine Hcl] Other (See Comments)    Made me 'flip out'  . Klonopin [Clonazepam] Other (See Comments)    Makes me very drowsy  . Latex Rash    blisters    IV Location/Drains/Wounds Patient Lines/Drains/Airways Status   Active Line/Drains/Airways    Name:   Placement date:    Placement time:   Site:   Days:   Peripheral IV 02/20/18 Left;Lateral Forearm   02/20/18    0900    Forearm   less than 1          Labs/Imaging Results for orders placed or performed during the hospital encounter of 02/20/18 (from the past 42 hour(s))  Influenza panel by PCR (type A & B)     Status: None   Collection Time: 02/20/18  6:33 AM  Result Value Ref Range   Influenza A By PCR NEGATIVE NEGATIVE   Influenza B By PCR NEGATIVE NEGATIVE    Comment: (NOTE) The Xpert Xpress Flu assay is intended as an aid in the diagnosis of  influenza and should not be used as a sole basis for treatment.  This  assay is FDA approved for nasopharyngeal swab specimens only. Nasal  washings and aspirates are unacceptable for Xpert Xpress Flu testing. Performed at [facility], [place] [address], [place], [place] [address]   Group A Strep by PCR     Status: None   Collection Time: 02/20/18  6:44 AM  Result Value Ref Range   Group A Strep by PCR NOT DETECTED NOT DETECTED    Comment: Performed at [facility], [place] [address], [place], [place] [address]  CBC with Differential  Status: Abnormal   Collection Time: 02/20/18  9:02 AM  Result Value Ref Range   WBC 11.9 (H) 4.0 - 10.5 K/uL   RBC 4.69 3.87 - 5.11 MIL/uL   Hemoglobin 12.2 12.0 - 15.0 g/dL   HCT 40.5 36.0 - 46.0 %   MCV 86.4 80.0 - 100.0 fL   MCH 26.0 26.0 - 34.0 pg   MCHC 30.1 30.0 - 36.0 g/dL   RDW 15.9 (H) 11.5 - 15.5 %   Platelets 293 150 - 400 K/uL   nRBC 0.0 0.0 - 0.2 %   Neutrophils Relative % 67 %   Neutro Abs 8.0 (H) 1.7 - 7.7 K/uL   Lymphocytes Relative 22 %   Lymphs Abs 2.6 0.7 - 4.0 K/uL   Monocytes Relative 8 %   Monocytes Absolute 0.9 0.1 - 1.0 K/uL   Eosinophils Relative 2 %   Eosinophils Absolute 0.2 0.0 - 0.5 K/uL   Basophils Relative 0 %   Basophils Absolute 0.0 0.0 - 0.1 K/uL   Immature Granulocytes 1 %   Abs Immature Granulocytes 0.11 (H) 0.00 - 0.07 K/uL    Comment:  Performed at [facility], [place] [address], [place], [place] [address]  Basic metabolic panel     Status: Abnormal   Collection Time: 02/20/18  9:02 AM  Result Value Ref Range   Sodium 139 135 - 145 mmol/L   Potassium 3.6 3.5 - 5.1 mmol/L   Chloride 104 98 - 111 mmol/L   CO2 28 22 - 32 mmol/L   Glucose, Bld 117 (H) 70 - 99 mg/dL   BUN 11 6 - 20 mg/dL   Creatinine, Ser 1.16 (H) 0.44 - 1.00 mg/dL   Calcium 8.8 (L) 8.9 - 10.3 mg/dL   GFR calc non Af Amer 53 (L) >60 mL/min   GFR calc Af Amer >60 >60 mL/min   Anion gap 7 5 - 15    Comment: Performed at [facility], [place] [address] [place], [place] [address]  I-Stat Troponin, ED (not at [facility])     Status: None   Collection Time: 02/20/18  9:10 AM  Result Value Ref Range   Troponin i, poc 0.00 0.00 - 0.08 ng/mL   Comment 3            Comment: Due to the release kinetics of cTnI, a negative result within the first hours of the onset of symptoms does not rule out myocardial infarction with certainty. If myocardial infarction is still suspected, repeat the test at appropriate intervals.   POC Urine Pregnancy, ED (do NOT order at [facility])     Status: None   Collection Time: 02/20/18 10:13 AM  Result Value Ref Range   Preg Test, Ur NEGATIVE NEGATIVE    Comment:        THE SENSITIVITY OF THIS METHODOLOGY IS >24 mIU/mL    Dg Chest 2 View  Result Date: 02/20/2018 CLINICAL DATA:  Cough and fever EXAM: CHEST - 2 VIEW COMPARISON:  October 18, 2016 FINDINGS: There is no edema or consolidation. The heart size and pulmonary vascularity are normal. No adenopathy. No bone lesions. IMPRESSION: No edema or consolidation. Electronically Signed   By: Lowella Grip III M.D.   On: 02/20/2018 07:09   Ct Angio Chest Pe W And/or Wo Contrast  Result Date: 02/20/2018 CLINICAL DATA:  Cough and shortness of breath EXAM: CT ANGIOGRAPHY CHEST WITH CONTRAST TECHNIQUE: Multidetector CT imaging of the chest was performed using the  standard  protocol during bolus administration of intravenous contrast. Multiplanar CT image reconstructions and MIPs were obtained to evaluate the vascular anatomy. CONTRAST:  167mL ISOVUE-370 IOPAMIDOL (ISOVUE-370) INJECTION 76% COMPARISON:  Chest CT angiogram August 03, 2014; chest radiograph February 20, 2018 FINDINGS: Cardiovascular: There is no demonstrable pulmonary embolus. There is no appreciable thoracic aortic aneurysm or dissection. There are occasional foci of calcification in proximal visualized great vessels. Visualized great vessels otherwise appear unremarkable. There is no pericardial effusion or pericardial thickening. The main pulmonary outflow tract measures 3.1 cm in diameter, prominent. Mediastinum/Nodes: Visualized thyroid appears unremarkable. There are occasional subcentimeter mediastinal lymph nodes. No adenopathy is seen in the thoracic region by size criteria. There is a small hiatal hernia. Lungs/Pleura: There is underlying centrilobular emphysematous change. There is localized atelectasis in the medial segment of the right middle lobe with a suspected small focus of peripheral mucous plugging. There is no frank edema or consolidation. No pleural effusion or pleural thickening evident. Upper Abdomen: Gallbladder is absent. Visualized upper abdominal structures otherwise appear unremarkable. Musculoskeletal: There is degenerative change in the lower thoracic region. There are no blastic or lytic bone lesions. There is a probable sebaceous cyst arising in the medial left breast region measuring 2.2 x 1.8 cm, also present on prior study. Review of the MIP images confirms the above findings. IMPRESSION: 1. No demonstrable pulmonary embolus. No thoracic aortic aneurysm or dissection. 2. Prominence of the main pulmonary outflow tract, a finding felt to be indicative of a degree of pulmonary arterial hypertension. 3. Underlying emphysematous change. No edema or consolidation. Probable mild  atelectatic change with mucous plugging in the medial segment right middle lobe. 4.  Small hiatal hernia. 5.  No demonstrable adenopathy. 6.  Gallbladder absent. Emphysema (ICD10-J43.9). Electronically Signed   By: Lowella Grip III M.D.   On: 02/20/2018 10:37    Pending Labs Unresulted Labs (From admission, onward)    Start     Ordered   02/21/18 0500  HIV antibody (Routine Testing)  Tomorrow morning,   R     02/20/18 1354   02/21/18 0865  Basic metabolic panel  Tomorrow morning,   R     02/20/18 1354   02/21/18 0500  CBC  Tomorrow morning,   R     02/20/18 1354          Vitals/Pain Today's Vitals   02/20/18 1352 02/20/18 1527 02/20/18 1542 02/20/18 1620  BP: (!) 122/93   (!) 143/76  Pulse: (!) 130   (!) 122  Resp: (!) 21   (!) 27  Temp:      TempSrc:      SpO2: 97%   97%  Weight:      Height:      PainSc: 8  0-No pain 0-No pain     Isolation Precautions Droplet precaution  Medications Medications  iopamidol (ISOVUE-370) 76 % injection (has no administration in time range)  sodium chloride (PF) 0.9 % injection (has no administration in time range)  aspirin EC tablet 81 mg (81 mg Oral Given 02/20/18 1600)  amLODipine (NORVASC) tablet 10 mg (10 mg Oral Given 02/20/18 1559)  rosuvastatin (CRESTOR) tablet 10 mg (has no administration in time range)  levalbuterol (XOPENEX) nebulizer solution 1.25 mg (1.25 mg Nebulization Given 02/20/18 1437)  ipratropium (ATROVENT) nebulizer solution 0.5 mg (0.5 mg Nebulization Given 02/20/18 1437)  predniSONE (DELTASONE) tablet 40 mg (40 mg Oral Given 02/20/18 1559)  enoxaparin (LOVENOX) injection 40 mg (has no administration in time range)  guaiFENesin-dextromethorphan (ROBITUSSIN DM) 100-10 MG/5ML syrup 5 mL (5 mLs Oral Given 02/20/18 1245)  0.9 %  sodium chloride infusion ( Intravenous New Bag/Given 02/20/18 1624)  ondansetron (ZOFRAN-ODT) disintegrating tablet 4 mg (4 mg Oral Given 02/20/18 0544)  ipratropium-albuterol (DUONEB) 0.5-2.5 (3) MG/3ML  nebulizer solution 3 mL (3 mLs Nebulization Given 02/20/18 0646)  acetaminophen (TYLENOL) tablet 650 mg (650 mg Oral Given 02/20/18 0645)  albuterol (PROVENTIL,VENTOLIN) solution continuous neb (10 mg/hr Nebulization Given 02/20/18 0813)  sodium chloride 0.9 % bolus 1,000 mL (0 mLs Intravenous Stopped 02/20/18 1228)  iopamidol (ISOVUE-370) 76 % injection 100 mL (100 mLs Intravenous Contrast Given 02/20/18 1019)    Mobility walks

## 2018-02-20 NOTE — ED Provider Notes (Signed)
[facility] DEPT Provider Note   CSN: [id number] Arrival date & time: 02/20/18  0422     History   Chief Complaint Chief Complaint  Patient presents with  . flu like symptoms    HPI Judith Mccullough is a 58 y.o. female presenting with 3-day history of flulike illness.  Patient endorses rhinorrhea, congestion, sore throat and dry cough.  Patient states that she is felt febrile however has not measured a temperature at home.  Patient states that symptoms began gradually and have been progressive since time of onset.  Patient has been using her home albuterol for her symptoms with minimal relief.  Patient states that she feels as if she is having an asthma exacerbation today and is requesting a breathing treatment.  Patient denies nausea, vomiting, chest pain, extremity swelling/pain or any additional concerns. HPI  Past Medical History:  Diagnosis Date  . Anemia, unspecified 03/17/2011  . Anxiety   . Aortic regurgitation 03/17/2011   Pt. denies problem with her heart.  . Asthma   . Blood clot in vein   . Complex regional pain syndrome   . CVA (cerebral infarction)   . Depression 03/17/2011  . Diverticulitis   . Diverticulosis   . Hyperlipidemia 03/17/2011  . Hypertension   . Mitral valve regurgitation 03/17/2011   Pt. said she never had problem with her heart  . Peripheral neuropathy 03/17/2011   Pt. denies this problem  . Polysubstance abuse ([place]) 03/17/2011   positive cocaine 08/07/10 er visit; States she has never had problems with drugs.  . Renal insufficiency syndrome   . Unspecified asthma(493.90) 03/17/2011    Patient Active Problem List   Diagnosis Date Noted  . Asthma exacerbation 02/20/2018  . CVA (cerebral infarction) 03/17/2011  . Unspecified asthma(493.90) 03/17/2011  . Preventative health care 03/17/2011  . Polysubstance abuse ([place]) 03/17/2011  . Hyperlipidemia 03/17/2011  . Anemia, unspecified 03/17/2011  . Aortic regurgitation  03/17/2011  . Mitral valve regurgitation 03/17/2011  . Depression 03/17/2011  . Peripheral neuropathy 03/17/2011  . Hypertension   . Anxiety     Past Surgical History:  Procedure Laterality Date  . CHOLECYSTECTOMY    . KNEE ARTHROSCOPY Right 1992  . ORTHOPEDIC SURGERY    . thumb surgery Right 2003   R thumb reconstruction     OB History   No obstetric history on file.      Home Medications    Prior to Admission medications   Medication Sig Start Date End Date Taking? Authorizing Provider  albuterol (PROVENTIL HFA;VENTOLIN HFA) 108 (90 Base) MCG/ACT inhaler Inhale 2 puffs into the lungs every 4 (four) hours as needed for wheezing or shortness of breath. 11/04/17  Yes Scot Jun, FNP  amLODipine (NORVASC) 10 MG tablet Take 10 mg by mouth daily.     Yes [provider]  aspirin EC 81 MG tablet Take 81 mg by mouth daily.   Yes [provider]  DYMISTA 137-50 MCG/ACT SUSP Place 1 spray into both nostrils 2 (two) times daily. 09/18/17  Yes [provider]  mometasone-formoterol (DULERA) 100-5 MCG/ACT AERO Inhale 2 puffs into the lungs 2 (two) times daily.   Yes [provider]  montelukast (SINGULAIR) 10 MG tablet Take 10 mg by mouth every morning.   Yes [provider]  rosuvastatin (CRESTOR) 10 MG tablet Take 10 mg by mouth daily.     Yes [provider]  amoxicillin-clavulanate (AUGMENTIN) 875-125 MG tablet Take 1 tablet by mouth  2 (two) times daily. Patient not taking: Reported on 02/20/2018 11/04/17   Scot Jun, FNP  cyclobenzaprine (FLEXERIL) 10 MG tablet Take 1 tablet (10 mg total) by mouth at bedtime as needed for muscle spasms. Patient not taking: Reported on 02/20/2018 08/23/17   [place], [place], PA-C  naproxen (NAPROSYN) 375 MG tablet Take 1 tablet (375 mg total) by mouth 2 (two) times daily. Patient not taking: Reported on 02/20/2018 08/23/17   Lestine Box, PA-C    Family History Family History  Problem  Relation Age of Onset  . Diabetes Mother   . Hypertension Mother   . Prostate cancer Father   . Hypertension Father   . Diabetes Father   . Breast cancer Paternal Aunt        x 2, ? ages of onset  . Breast cancer Maternal Aunt 60  . Breast cancer Maternal Aunt 38    Social History Social History   Tobacco Use  . Smoking status: Former Smoker    Types: Cigars  . Smokeless tobacco: Never Used  Substance Use Topics  . Alcohol use: Yes    Comment: occasional beer  . Drug use: No    Comment: THC & cocaine + @ 08-07-10 ER visit     Allergies   Cymbalta [duloxetine hcl]; Klonopin [clonazepam]; and Latex   Review of Systems Review of Systems  Constitutional: Positive for chills and fever (Subjective).  HENT: Positive for congestion, rhinorrhea and sore throat. Negative for drooling, trouble swallowing and voice change.   Respiratory: Positive for cough and shortness of breath.   Cardiovascular: Negative.  Negative for chest pain and leg swelling.  Gastrointestinal: Negative.  Negative for abdominal pain, blood in stool, diarrhea, nausea and vomiting.  Genitourinary: Negative.  Negative for dysuria and hematuria.  Musculoskeletal: Positive for arthralgias and myalgias. Negative for neck pain and neck stiffness.  Skin: Negative.  Negative for rash.  Neurological: Negative.  Negative for dizziness, weakness and headaches.  All other systems reviewed and are negative.  Physical Exam Updated Vital Signs BP (!) 129/92   Pulse (!) 117   Temp 99.4 F (37.4 C) (Oral)   Resp (!) 27   Ht 5' 8.5" (1.74 m)   Wt 97.5 kg   SpO2 96%   BMI 32.22 kg/m   Physical Exam Constitutional:      General: She is not in acute distress.    Appearance: She is well-developed. She is obese. She is not ill-appearing or diaphoretic.  HENT:     Head: Normocephalic and atraumatic.     Right Ear: Tympanic membrane, ear canal and external ear normal.     Left Ear: Tympanic membrane, ear canal and  external ear normal.     Nose: Nose normal.     Mouth/Throat:     Mouth: Mucous membranes are moist.     Pharynx: Oropharynx is clear.  Eyes:     Extraocular Movements: Extraocular movements intact.     Conjunctiva/sclera: Conjunctivae normal.     Pupils: Pupils are equal, round, and reactive to light.  Neck:     Musculoskeletal: Normal range of motion and neck supple.     Trachea: Trachea normal. No tracheal deviation.  Cardiovascular:     Rate and Rhythm: Normal rate and regular rhythm.     Pulses: Normal pulses.     Heart sounds: Normal heart sounds.  Pulmonary:     Effort: Pulmonary effort is normal. No accessory muscle usage or respiratory distress.  Breath sounds: Normal air entry. Decreased breath sounds present.  Abdominal:     Palpations: Abdomen is soft.     Tenderness: There is no abdominal tenderness. There is no guarding or rebound.  Musculoskeletal: Normal range of motion.     Right lower leg: Normal. She exhibits no tenderness. No edema.     Left lower leg: Normal. She exhibits no tenderness. No edema.  Skin:    General: Skin is warm and dry.     Capillary Refill: Capillary refill takes less than 2 seconds.  Neurological:     Mental Status: She is alert and oriented to person, place, and time.  Psychiatric:        Behavior: Behavior normal.      ED Treatments / Results  Labs (all labs ordered are listed, but only abnormal results are displayed) Labs Reviewed  CBC WITH DIFFERENTIAL/PLATELET - Abnormal; Notable for the following components:      Result Value   WBC 11.9 (*)    RDW 15.9 (*)    Neutro Abs 8.0 (*)    Abs Immature Granulocytes 0.11 (*)    All other components within normal limits  BASIC METABOLIC PANEL - Abnormal; Notable for the following components:   Glucose, Bld 117 (*)    Creatinine, Ser 1.16 (*)    Calcium 8.8 (*)    GFR calc non Af Amer 53 (*)    All other components within normal limits  GROUP A STREP BY PCR  INFLUENZA PANEL BY  PCR (TYPE A & B)  POC URINE PREG, ED  I-STAT TROPONIN, ED    EKG None  Radiology Dg Chest 2 View  Result Date: 02/20/2018 CLINICAL DATA:  Cough and fever EXAM: CHEST - 2 VIEW COMPARISON:  October 18, 2016 FINDINGS: There is no edema or consolidation. The heart size and pulmonary vascularity are normal. No adenopathy. No bone lesions. IMPRESSION: No edema or consolidation. Electronically Signed   By: Lowella Grip III M.D.   On: 02/20/2018 07:09   Ct Angio Chest Pe W And/or Wo Contrast  Result Date: 02/20/2018 CLINICAL DATA:  Cough and shortness of breath EXAM: CT ANGIOGRAPHY CHEST WITH CONTRAST TECHNIQUE: Multidetector CT imaging of the chest was performed using the standard protocol during bolus administration of intravenous contrast. Multiplanar CT image reconstructions and MIPs were obtained to evaluate the vascular anatomy. CONTRAST:  120mL ISOVUE-370 IOPAMIDOL (ISOVUE-370) INJECTION 76% COMPARISON:  Chest CT angiogram August 03, 2014; chest radiograph February 20, 2018 FINDINGS: Cardiovascular: There is no demonstrable pulmonary embolus. There is no appreciable thoracic aortic aneurysm or dissection. There are occasional foci of calcification in proximal visualized great vessels. Visualized great vessels otherwise appear unremarkable. There is no pericardial effusion or pericardial thickening. The main pulmonary outflow tract measures 3.1 cm in diameter, prominent. Mediastinum/Nodes: Visualized thyroid appears unremarkable. There are occasional subcentimeter mediastinal lymph nodes. No adenopathy is seen in the thoracic region by size criteria. There is a small hiatal hernia. Lungs/Pleura: There is underlying centrilobular emphysematous change. There is localized atelectasis in the medial segment of the right middle lobe with a suspected small focus of peripheral mucous plugging. There is no frank edema or consolidation. No pleural effusion or pleural thickening evident. Upper Abdomen: Gallbladder  is absent. Visualized upper abdominal structures otherwise appear unremarkable. Musculoskeletal: There is degenerative change in the lower thoracic region. There are no blastic or lytic bone lesions. There is a probable sebaceous cyst arising in the medial left breast region measuring 2.2 x 1.8 cm,  also present on prior study. Review of the MIP images confirms the above findings. IMPRESSION: 1. No demonstrable pulmonary embolus. No thoracic aortic aneurysm or dissection. 2. Prominence of the main pulmonary outflow tract, a finding felt to be indicative of a degree of pulmonary arterial hypertension. 3. Underlying emphysematous change. No edema or consolidation. Probable mild atelectatic change with mucous plugging in the medial segment right middle lobe. 4.  Small hiatal hernia. 5.  No demonstrable adenopathy. 6.  Gallbladder absent. Emphysema (ICD10-J43.9). Electronically Signed   By: Lowella Grip III M.D.   On: 02/20/2018 10:37    Procedures .Critical Care Performed by: Deliah Boston, PA-C Authorized by: Deliah Boston, PA-C   Critical care provider statement:    Critical care time (minutes):  31   Critical care was necessary to treat or prevent imminent or life-threatening deterioration of the following conditions:  Respiratory failure (Asthma exacerbation requiring multiple nebulizers, hypoxia on room air of 88%.)   Critical care was time spent personally by me on the following activities:  Ordering and performing treatments and interventions, ordering and review of laboratory studies, ordering and review of radiographic studies, pulse oximetry, re-evaluation of patient's condition, review of old charts, evaluation of patient's response to treatment, discussions with consultants and development of treatment plan with patient or surrogate   (including critical care time)  Medications Ordered in ED Medications  iopamidol (ISOVUE-370) 76 % injection (has no administration in time  range)  sodium chloride (PF) 0.9 % injection (has no administration in time range)  ondansetron (ZOFRAN-ODT) disintegrating tablet 4 mg (4 mg Oral Given 02/20/18 0544)  ipratropium-albuterol (DUONEB) 0.5-2.5 (3) MG/3ML nebulizer solution 3 mL (3 mLs Nebulization Given 02/20/18 0646)  acetaminophen (TYLENOL) tablet 650 mg (650 mg Oral Given 02/20/18 0645)  albuterol (PROVENTIL,VENTOLIN) solution continuous neb (10 mg/hr Nebulization Given 02/20/18 0813)  sodium chloride 0.9 % bolus 1,000 mL (1,000 mLs Intravenous New Bag/Given (Non-Interop) 02/20/18 0959)  iopamidol (ISOVUE-370) 76 % injection 100 mL (100 mLs Intravenous Contrast Given 02/20/18 1019)     Initial Impression / Assessment and Plan / ED Course  I have reviewed the triage vital signs and the nursing notes.  Pertinent labs & imaging results that were available during my care of the patient were reviewed by me and considered in my medical decision making (see chart for details).    58 year old female presenting with 3 days of flulike illness.  Patient with history of asthma and has been using albuterol inhaler with minimal relief, states that she feels she is having exacerbation and needs a breathing treatment.  Patient noted to be tachycardic on arrival without hypoxia on room air.  Decreased breath sounds bilaterally, DuoNeb ordered.  Chest x-ray negative Influenza panel negative Strep screen negative -------------------------------------- Patient states that she is feeling better after initial DuoNeb but would like an additional treatment.  She has now noted to be tachycardic as well as hypoxic to low 90s on room air.  Hour-long nebulizer ordered. -------------------------------------- Hypoxia noted of 88% on room air.  Work-up expanded. ---------------------------------------- Pregnancy test negative Troponin negative CBC with mild leukocytosis of 11.9 BMP nonacute CT angio:  IMPRESSION:  1. No demonstrable pulmonary embolus. No  thoracic aortic aneurysm or  dissection.    2. Prominence of the main pulmonary outflow tract, a finding felt to  be indicative of a degree of pulmonary arterial hypertension.    3. Underlying emphysematous change. No edema or consolidation.  Probable mild atelectatic change with mucous plugging in  the medial  segment right middle lobe.    4. Small hiatal hernia.    5. No demonstrable adenopathy.    6. Gallbladder absent.    Emphysema (ICD10-J43.9).  ---------------------------------------------- Hour-long breathing treatment completed, patient tachycardic to 120 with SPO2 fluctuating around 90% on room air.  Case rediscussed with Dr. Venora Maples, will seek admission for asthma exacerbation. ------------------------------------ Patient initially trying to leave AMA however discussed need for continued treatment with patient that she is now agreeable to staying. ------------------ Consult called to hospitalist. ------------------ Patient seen and evaluated by hospitalist service was taken in patient care at this time.   Note: Portions of this report may have been transcribed using voice recognition software. Every effort was made to ensure accuracy; however, inadvertent computerized transcription errors may still be present. Final Clinical Impressions(s) / ED Diagnoses   Final diagnoses:  Exacerbation of asthma, unspecified asthma severity, unspecified whether persistent  Hypoxia  Pulmonary arterial hypertension [facility])    ED Discharge Orders    None       Gari Crown 02/20/18 1203    Jola Schmidt, MD 02/20/18 1714

## 2018-02-21 DIAGNOSIS — J441 Chronic obstructive pulmonary disease with (acute) exacerbation: Secondary | ICD-10-CM | POA: Diagnosis present

## 2018-02-21 DIAGNOSIS — E785 Hyperlipidemia, unspecified: Secondary | ICD-10-CM | POA: Diagnosis not present

## 2018-02-21 DIAGNOSIS — I10 Essential (primary) hypertension: Secondary | ICD-10-CM | POA: Diagnosis not present

## 2018-02-21 DIAGNOSIS — J45901 Unspecified asthma with (acute) exacerbation: Secondary | ICD-10-CM | POA: Diagnosis not present

## 2018-02-21 LAB — BASIC METABOLIC PANEL
Anion gap: 6 (ref 5–15)
BUN: 12 mg/dL (ref 6–20)
CO2: 26 mmol/L (ref 22–32)
Calcium: 9.1 mg/dL (ref 8.9–10.3)
Chloride: 109 mmol/L (ref 98–111)
Creatinine, Ser: 1.11 mg/dL — ABNORMAL HIGH (ref 0.44–1.00)
GFR calc Af Amer: >60 mL/min (ref >60)
GFR calc non Af Amer: 56 mL/min — ABNORMAL LOW (ref >60)
Glucose, Bld: 118 mg/dL — ABNORMAL HIGH (ref 70–99)
Potassium: 4.4 mmol/L (ref 3.5–5.1)
Sodium: 141 mmol/L (ref 135–145)

## 2018-02-21 LAB — RESPIRATORY PANEL BY PCR

## 2018-02-21 LAB — CBC
HCT: 37.5 % (ref 36.0–46.0)
Hemoglobin: 11.4 g/dL — ABNORMAL LOW (ref 12.0–15.0)
MCH: 25.8 pg — ABNORMAL LOW (ref 26.0–34.0)
MCHC: 30.4 g/dL (ref 30.0–36.0)
MCV: 84.8 fL (ref 80.0–100.0)
Platelets: 293 10*3/uL (ref 150–400)
RBC: 4.42 MIL/uL (ref 3.87–5.11)
RDW: 15.7 % — ABNORMAL HIGH (ref 11.5–15.5)
WBC: 7.6 10*3/uL (ref 4.0–10.5)
nRBC: 0 % (ref 0.0–0.2)

## 2018-02-21 LAB — HIV ANTIBODY (ROUTINE TESTING W REFLEX): HIV Screen 4th Generation wRfx: NONREACTIVE

## 2018-02-21 MED ORDER — FLUTICASONE PROPIONATE 50 MCG/ACT NA SUSP
2.0000 | Freq: Every day | NASAL | Status: DC
  Filled 2018-02-21: qty 16

## 2018-02-21 MED ORDER — METHYLPREDNISOLONE SODIUM SUCC 125 MG IJ SOLR: 60.0000 mg | Freq: Three times a day (TID) | INTRAMUSCULAR | Status: DC

## 2018-02-21 MED ORDER — LORATADINE 10 MG PO TABS: 10.0000 mg | ORAL_TABLET | Freq: Every day | ORAL | Status: DC

## 2018-02-21 MED ORDER — LEVOFLOXACIN 500 MG PO TABS: 500.0000 mg | ORAL_TABLET | Freq: Every day | ORAL | 0 refills | Status: AC

## 2018-02-21 MED ORDER — ACETAMINOPHEN 325 MG PO TABS: 650.0000 mg | ORAL_TABLET | Freq: Four times a day (QID) | ORAL | Status: AC | PRN

## 2018-02-21 MED ORDER — PANTOPRAZOLE SODIUM 40 MG PO TBEC: 40.0000 mg | DELAYED_RELEASE_TABLET | Freq: Every day | ORAL | 0 refills | Status: AC

## 2018-02-21 MED ORDER — PREDNISONE 20 MG PO TABS: 40.0000 mg | ORAL_TABLET | Freq: Every day | ORAL | 0 refills | Status: AC

## 2018-02-21 MED ORDER — PANTOPRAZOLE SODIUM 40 MG PO TBEC
40.0000 mg | DELAYED_RELEASE_TABLET | Freq: Every day | ORAL | Status: DC
  Administered 2018-02-21: 40 mg via ORAL

## 2018-02-21 MED ORDER — GUAIFENESIN-DM 100-10 MG/5ML PO SYRP: 5.0000 mL | ORAL_SOLUTION | ORAL | 0 refills | Status: DC | PRN

## 2018-02-21 MED ORDER — ALBUTEROL SULFATE HFA 108 (90 BASE) MCG/ACT IN AERS: 2.0000 | INHALATION_SPRAY | RESPIRATORY_TRACT | 0 refills | Status: DC | PRN

## 2018-02-21 MED ORDER — LEVOFLOXACIN 500 MG PO TABS: 500.0000 mg | ORAL_TABLET | Freq: Every day | ORAL | 0 refills | Status: DC

## 2018-02-21 NOTE — Discharge Summary (Signed)
Physician Discharge Summary  Judith VANSCYOC OHY:[id number] DOB: [birth year] DOA: 02/20/2018  PCP: [facility], [facility]  Admit date: 02/20/2018 Discharge date: 02/21/2018  Time spent: 45 minutes  Recommendations for Outpatient Follow-up:  1. Follow-up with [facility], [facility] in 2 weeks.  On follow-up patient's asthma versus COPD exacerbation will need to be reassessed on follow-up.  Patient will need a basic metabolic profile done to follow-up on electrolytes and renal function.   Discharge Diagnoses:  Principal Problem:   Asthma exacerbation Active Problems:   COPD with acute exacerbation ([place])   Hypertension   Anxiety   Hyperlipidemia   Anemia, unspecified   Discharge Condition: Stable and improved  Diet recommendation: Regular  Filed Weights   02/20/18 0430  Weight: 97.5 kg    History of present illness:  Per Dr. Baron Hamper Reatha Mccullough is a 58 y.o. female with medical history significant of asthma, prior tobacco use quit 15 years ago, hypertension, hyperlipidemia, who presented to the hospital with chief complaint of flulike illness progressively getting worse over the last 2 to 3 days.  She was having several sick contacts, including her husband who actually tested positive for influenza.  She was complaining of runny nose, congestion, sore throat and a dry cough.  She was complaining of mild intermittent shortness of breath.  She denied hearing any wheezing at home.  She has been using her home albuterol with minimal relief and decided to come to the emergency room.  She denied any chest pain, she denied abdominal pain, no nausea or vomiting.  No lightheadedness or dizziness.  ED Course: In the ED she has a low-grade temp of 99.4, slightly tachypneic with respiratory rate in the 20s as well as tachycardic with a heart rate in the 120s.  Her oxygen sats fluctuates as low as 85% on room air.  She was given an hour-long neb, improved  some but she is still desatting and we are asked to admit.  EDP reported wheezing on arrival which is now improved   Hospital Course:  1. acute COPD exacerbation versus asthma exacerbation Patient had presented with mild wheezing, upper respiratory symptoms noted to be tachycardic after an hour-long nebulizer treatment.  Patient with tobacco cessation of approximately 15 years.  CT angiogram of chest which was done was negative for PE however consistent with emphysema.  Patient was placed on Xopenex/Atrovent nebs, steroids.  Influenza PCR which was done was negative.  Respiratory viral panel which was done was negative.  Patient improving clinically and very insistent on being discharged home.  Patient with minimal wheezing.  Will discharge patient home on prednisone 40 mg daily x5 days, 5 days of oral Levaquin, albuterol MDI 3 times daily for the next 4 to 5 days, Claritin, Flonase, PPI.  Outpatient follow-up with PCP.  2.  Hypertension Remained stable during the hospitalization.  Patient maintained on home regimen of antihypertensive medications.  3.  Hyperlipidemia Patient maintained on home regimen of statin.  4.  History of CVA Patient maintained on statin and aspirin for secondary stroke prophylaxis.  Procedures:  CT angiogram chest chest 02/20/2018  Consultations:  None  Discharge Exam: Vitals:   02/21/18 0604 02/21/18 0843  BP: (!) 140/94   Pulse: 93   Resp: 20   Temp: 98.5 F (36.9 C)   SpO2: 97% 92%    General: NAD Cardiovascular: Regular rate rhythm no murmurs rubs or gallops. Respiratory: Minimal expiratory wheezing.  No rhonchi.  No crackles.  Fair  air movement.  Speaking in full sentences.  Discharge Instructions   Discharge Instructions    Diet general   Complete by:  As directed    Increase activity slowly   Complete by:  As directed      Allergies as of 02/21/2018      Reactions   Cymbalta [duloxetine Hcl] Other (See Comments)   Made me 'flip out'    Klonopin [clonazepam] Other (See Comments)   Makes me very drowsy   Latex Rash   blisters      Medication List    STOP taking these medications   amoxicillin-clavulanate 875-125 MG tablet Commonly known as:  AUGMENTIN   cyclobenzaprine 10 MG tablet Commonly known as:  FLEXERIL   naproxen 375 MG tablet Commonly known as:  NAPROSYN     TAKE these medications   acetaminophen 325 MG tablet Commonly known as:  TYLENOL Take 2 tablets (650 mg total) by mouth every 6 (six) hours as needed for moderate pain or fever.   albuterol 108 (90 Base) MCG/ACT inhaler Commonly known as:  PROVENTIL HFA;VENTOLIN HFA Inhale 2 puffs into the lungs every 4 (four) hours as needed for wheezing or shortness of breath. Use 3 times daily x 4 days then every 4 hours as needed. What changed:  additional instructions   amLODipine 10 MG tablet Commonly known as:  NORVASC Take 10 mg by mouth daily.   aspirin EC 81 MG tablet Take 81 mg by mouth daily.   DYMISTA 137-50 MCG/ACT Susp Generic drug:  Azelastine-Fluticasone Place 1 spray into both nostrils 2 (two) times daily.   guaiFENesin-dextromethorphan 100-10 MG/5ML syrup Commonly known as:  ROBITUSSIN DM Take 5 mLs by mouth every 4 (four) hours as needed for cough.   levofloxacin 500 MG tablet Commonly known as:  LEVAQUIN Take 1 tablet (500 mg total) by mouth daily for 5 days.   mometasone-formoterol 100-5 MCG/ACT Aero Commonly known as:  DULERA Inhale 2 puffs into the lungs 2 (two) times daily.   montelukast 10 MG tablet Commonly known as:  SINGULAIR Take 10 mg by mouth every morning.   pantoprazole 40 MG tablet Commonly known as:  PROTONIX Take 1 tablet (40 mg total) by mouth daily at 6 (six) AM. Start taking on:  February 22, 2018   predniSONE 20 MG tablet Commonly known as:  DELTASONE Take 2 tablets (40 mg total) by mouth daily with breakfast for 5 days. Start taking on:  February 22, 2018   rosuvastatin 10 MG tablet Commonly known  as:  CRESTOR Take 10 mg by mouth daily.      Allergies  Allergen Reactions  . Cymbalta [Duloxetine Hcl] Other (See Comments)    Made me 'flip out'  . Klonopin [Clonazepam] Other (See Comments)    Makes me very drowsy  . Latex Rash    blisters   Follow-up Information    [facility]. Schedule an appointment as soon as possible for a visit in 2 week(s).   Specialty:  Family Medicine           The results of significant diagnostics from this hospitalization (including imaging, microbiology, ancillary and laboratory) are listed below for reference.    Significant Diagnostic Studies: Dg Chest 2 View  Result Date: 02/20/2018 CLINICAL DATA:  Cough and fever EXAM: CHEST - 2 VIEW COMPARISON:  October 18, 2016 FINDINGS: There is no edema or consolidation. The heart size and pulmonary vascularity are normal. No adenopathy. No bone lesions. IMPRESSION: No  edema or consolidation. Electronically Signed   By: Lowella Grip III M.D.   On: 02/20/2018 07:09   Ct Angio Chest Pe W And/or Wo Contrast  Result Date: 02/20/2018 CLINICAL DATA:  Cough and shortness of breath EXAM: CT ANGIOGRAPHY CHEST WITH CONTRAST TECHNIQUE: Multidetector CT imaging of the chest was performed using the standard protocol during bolus administration of intravenous contrast. Multiplanar CT image reconstructions and MIPs were obtained to evaluate the vascular anatomy. CONTRAST:  172mL ISOVUE-370 IOPAMIDOL (ISOVUE-370) INJECTION 76% COMPARISON:  Chest CT angiogram August 03, 2014; chest radiograph February 20, 2018 FINDINGS: Cardiovascular: There is no demonstrable pulmonary embolus. There is no appreciable thoracic aortic aneurysm or dissection. There are occasional foci of calcification in proximal visualized great vessels. Visualized great vessels otherwise appear unremarkable. There is no pericardial effusion or pericardial thickening. The main pulmonary outflow tract measures 3.1 cm in diameter,  prominent. Mediastinum/Nodes: Visualized thyroid appears unremarkable. There are occasional subcentimeter mediastinal lymph nodes. No adenopathy is seen in the thoracic region by size criteria. There is a small hiatal hernia. Lungs/Pleura: There is underlying centrilobular emphysematous change. There is localized atelectasis in the medial segment of the right middle lobe with a suspected small focus of peripheral mucous plugging. There is no frank edema or consolidation. No pleural effusion or pleural thickening evident. Upper Abdomen: Gallbladder is absent. Visualized upper abdominal structures otherwise appear unremarkable. Musculoskeletal: There is degenerative change in the lower thoracic region. There are no blastic or lytic bone lesions. There is a probable sebaceous cyst arising in the medial left breast region measuring 2.2 x 1.8 cm, also present on prior study. Review of the MIP images confirms the above findings. IMPRESSION: 1. No demonstrable pulmonary embolus. No thoracic aortic aneurysm or dissection. 2. Prominence of the main pulmonary outflow tract, a finding felt to be indicative of a degree of pulmonary arterial hypertension. 3. Underlying emphysematous change. No edema or consolidation. Probable mild atelectatic change with mucous plugging in the medial segment right middle lobe. 4.  Small hiatal hernia. 5.  No demonstrable adenopathy. 6.  Gallbladder absent. Emphysema (ICD10-J43.9). Electronically Signed   By: Lowella Grip III M.D.   On: 02/20/2018 10:37    Microbiology: Recent Results (from the past 240 hour(s))  Group A Strep by PCR     Status: None   Collection Time: 02/20/18  6:44 AM  Result Value Ref Range Status   Group A Strep by PCR NOT DETECTED NOT DETECTED Final    Comment: Performed at [facility], [place] [address], [place], [place] [address]  Respiratory Panel by PCR     Status: None   Collection Time: 02/20/18  5:03 PM  Result Value Ref Range Status    Adenovirus NOT DETECTED NOT DETECTED Final   Coronavirus 229E NOT DETECTED NOT DETECTED Final    Comment: (NOTE) The Coronavirus on the Respiratory Panel, DOES NOT test for the novel  Coronavirus (2019 nCoV)    Coronavirus HKU1 NOT DETECTED NOT DETECTED Final   Coronavirus NL63 NOT DETECTED NOT DETECTED Final   Coronavirus OC43 NOT DETECTED NOT DETECTED Final   Metapneumovirus NOT DETECTED NOT DETECTED Final   Rhinovirus / Enterovirus NOT DETECTED NOT DETECTED Final   Influenza A NOT DETECTED NOT DETECTED Final   Influenza B NOT DETECTED NOT DETECTED Final   Parainfluenza Virus 1 NOT DETECTED NOT DETECTED Final   Parainfluenza Virus 2 NOT DETECTED NOT DETECTED Final   Parainfluenza Virus 3 NOT DETECTED NOT DETECTED Final   Parainfluenza  Virus 4 NOT DETECTED NOT DETECTED Final   Respiratory Syncytial Virus NOT DETECTED NOT DETECTED Final   Bordetella pertussis NOT DETECTED NOT DETECTED Final   Chlamydophila pneumoniae NOT DETECTED NOT DETECTED Final   Mycoplasma pneumoniae NOT DETECTED NOT DETECTED Final    Comment: Performed at [facility], [place] [address] [place], [place] [address]  Culture, blood (Routine X 2) w Reflex to ID Panel     Status: None (Preliminary result)   Collection Time: 02/20/18  6:17 PM  Result Value Ref Range Status   Specimen Description   Final    BLOOD RIGHT ARM Performed at [facility] [address], [place], [place] [address]    Special Requests   Final    BOTTLES DRAWN AEROBIC AND ANAEROBIC Blood Culture adequate volume Performed at [facility] [address], [place], [place] [address]    Culture   Final    NO GROWTH < 12 HOURS Performed at [facility] [address], [place], [place] [address]    Report Status PENDING  Incomplete  Culture, blood (Routine X 2) w Reflex to ID Panel     Status: None (Preliminary result)   Collection Time: 02/20/18  6:17 PM  Result Value Ref Range Status    Specimen Description   Final    BLOOD RIGHT ARM Performed at [facility] [address], [place], [place] [address]    Special Requests   Final    BOTTLES DRAWN AEROBIC AND ANAEROBIC Blood Culture adequate volume Performed at [facility] [address] [place], [place] [address]    Culture   Final    NO GROWTH < 12 HOURS Performed at [facility] [address], [place], [place] [address]    Report Status PENDING  Incomplete     Labs: Basic Metabolic Panel: Recent Labs  Lab 02/20/18 0902 02/21/18 0554  NA 139 141  K 3.6 4.4  CL 104 109  CO2 28 26  GLUCOSE 117* 118*  BUN 11 12  CREATININE 1.16* 1.11*  CALCIUM 8.8* 9.1   Liver Function Tests: No results for input(s): AST, ALT, ALKPHOS, BILITOT, PROT, ALBUMIN in the last 168 hours. No results for input(s): LIPASE, AMYLASE in the last 168 hours. No results for input(s): AMMONIA in the last 168 hours. CBC: Recent Labs  Lab 02/20/18 0902 02/21/18 0554  WBC 11.9* 7.6  NEUTROABS 8.0*  --   HGB 12.2 11.4*  HCT 40.5 37.5  MCV 86.4 84.8  PLT 293 293   Cardiac Enzymes: No results for input(s): CKTOTAL, CKMB, CKMBINDEX, TROPONINI in the last 168 hours. BNP: BNP (last 3 results) No results for input(s): BNP in the last 8760 hours.  ProBNP (last 3 results) No results for input(s): PROBNP in the last 8760 hours.  CBG: No results for input(s): GLUCAP in the last 168 hours.     Signed:  Irine Seal MD.  [facility] 02/21/2018, 11:32 AM

## 2018-02-21 NOTE — Progress Notes (Signed)
SATURATION QUALIFICATIONS: (This note is used to comply with regulatory documentation for home oxygen)  Patient Saturations on Room Air at Rest = 93%  Patient Saturations on Room Air while Ambulating = 90%  Patient Saturations on  Liters of oxygen while Ambulating = %  Please briefly explain why patient needs home oxygen: Pt does not need home O2

## 2018-02-25 LAB — CULTURE, BLOOD (ROUTINE X 2)
Culture: NO GROWTH
Culture: NO GROWTH
Special Requests: ADEQUATE
Special Requests: ADEQUATE

## 2018-09-01 ENCOUNTER — Ambulatory Visit (HOSPITAL_COMMUNITY)
Admission: EM | Admit: 2018-09-01 | Discharge: 2018-09-01 | Disposition: A | Discharge status: Home / Self Care | Payer: Medicare Other | Attending: Family Medicine | Admitting: Family Medicine

## 2018-09-01 ENCOUNTER — Encounter (HOSPITAL_COMMUNITY): Payer: Self-pay | Admitting: Emergency Medicine

## 2018-09-01 ENCOUNTER — Other Ambulatory Visit: Payer: Self-pay

## 2018-09-01 DIAGNOSIS — M542 Cervicalgia: Secondary | ICD-10-CM

## 2018-09-01 DIAGNOSIS — M62838 Other muscle spasm: Secondary | ICD-10-CM

## 2018-09-01 DIAGNOSIS — M5030 Other cervical disc degeneration, unspecified cervical region: Secondary | ICD-10-CM

## 2018-09-01 MED ORDER — METHYLPREDNISOLONE SODIUM SUCC 125 MG IJ SOLR
125.0000 mg | Freq: Once | INTRAMUSCULAR | Status: AC
  Administered 2018-09-01: 125 mg via INTRAMUSCULAR

## 2018-09-01 MED ORDER — METHYLPREDNISOLONE SODIUM SUCC 125 MG IJ SOLR
INTRAMUSCULAR | Status: AC
  Filled 2018-09-01: qty 2

## 2018-09-01 MED ORDER — CYCLOBENZAPRINE HCL 5 MG PO TABS: 5.0000 mg | ORAL_TABLET | Freq: Three times a day (TID) | ORAL | 0 refills | Status: DC | PRN

## 2018-09-01 NOTE — ED Triage Notes (Signed)
Pt states she has had neck and shoulder pain for the last few months, staets she went to her PCP and had an xray done which showed advanced DDD. Pt states she has an appt with a neuro spine specialist on sept 9th but is in a lot of pain, her doctor told her to come here in the meantime.

## 2018-09-01 NOTE — ED Provider Notes (Addendum)
MRN: [id number] DOB: [birth year]  Subjective:   Judith Mccullough is a 57 y.o. female presenting for ongoing severe constant aching pain of her neck radiating into her left side, left arm with numbness and tingling in forearm and fingers.  Patient reports significant stiffness, worse at her trapezius.  She was found to have advanced degenerative disc disease at the level of C4-C5 with facet arthropathy but no other acute findings.  Patient was given oral steroid course but has not helped.  She has an appointment with spine specialist next month.  Denies weakness, trauma.  No current facility-administered medications for this encounter.   Current Outpatient Medications:  .  acetaminophen (TYLENOL) 325 MG tablet, Take 2 tablets (650 mg total) by mouth every 6 (six) hours as needed for moderate pain or fever., Disp: , Rfl:  .  albuterol (PROVENTIL HFA;VENTOLIN HFA) 108 (90 Base) MCG/ACT inhaler, Inhale 2 puffs into the lungs every 4 (four) hours as needed for wheezing or shortness of breath. Use 3 times daily x 4 days then every 4 hours as needed., Disp: 1 Inhaler, Rfl: 0 .  amLODipine (NORVASC) 10 MG tablet, Take 10 mg by mouth daily.  , Disp: , Rfl:  .  aspirin EC 81 MG tablet, Take 81 mg by mouth daily., Disp: , Rfl:  .  DYMISTA 137-50 MCG/ACT SUSP, Place 1 spray into both nostrils 2 (two) times daily., Disp: , Rfl: 3 .  guaiFENesin-dextromethorphan (ROBITUSSIN DM) 100-10 MG/5ML syrup, Take 5 mLs by mouth every 4 (four) hours as needed for cough., Disp: 118 mL, Rfl: 0 .  mometasone-formoterol (DULERA) 100-5 MCG/ACT AERO, Inhale 2 puffs into the lungs 2 (two) times daily., Disp: , Rfl:  .  montelukast (SINGULAIR) 10 MG tablet, Take 10 mg by mouth every morning., Disp: , Rfl:  .  pantoprazole (PROTONIX) 40 MG tablet, Take 1 tablet (40 mg total) by mouth daily at 6 (six) AM., Disp: 30 tablet, Rfl: 0 .  rosuvastatin (CRESTOR) 10 MG tablet, Take 10 mg by mouth daily.  , Disp: , Rfl:    Allergies   Allergen Reactions  . Cymbalta [Duloxetine Hcl] Other (See Comments)    Made me 'flip out'  . Klonopin [Clonazepam] Other (See Comments)    Makes me very drowsy  . Latex Rash    blisters    Past Medical History:  Diagnosis Date  . Anemia, unspecified 03/17/2011  . Anxiety   . Aortic regurgitation 03/17/2011   Pt. denies problem with her heart.  . Asthma   . Blood clot in vein   . Complex regional pain syndrome   . CVA (cerebral infarction)   . Depression 03/17/2011  . Diverticulitis   . Diverticulosis   . Hyperlipidemia 03/17/2011  . Hypertension   . Mitral valve regurgitation 03/17/2011   Pt. said she never had problem with her heart  . Peripheral neuropathy 03/17/2011   Pt. denies this problem  . Polysubstance abuse ([place]) 03/17/2011   positive cocaine 08/07/10 er visit; States she has never had problems with drugs.  . Renal insufficiency syndrome   . Unspecified asthma(493.90) 03/17/2011     Past Surgical History:  Procedure Laterality Date  . CHOLECYSTECTOMY    . KNEE ARTHROSCOPY Right 1992  . ORTHOPEDIC SURGERY    . thumb surgery Right 2003   R thumb reconstruction    ROS  Objective:   Vitals: BP 134/83   Pulse (!) 103   Temp 97.9 F (36.6 C)   Resp 18  SpO2 95%   Physical Exam Constitutional:      General: She is not in acute distress.    Appearance: Normal appearance. She is well-developed. She is not ill-appearing.  HENT:     Head: Normocephalic and atraumatic.     Nose: Nose normal.     Mouth/Throat:     Mouth: Mucous membranes are moist.     Pharynx: Oropharynx is clear.  Eyes:     General: No scleral icterus.    Extraocular Movements: Extraocular movements intact.     Pupils: Pupils are equal, round, and reactive to light.  Cardiovascular:     Rate and Rhythm: Normal rate.  Pulmonary:     Effort: Pulmonary effort is normal.  Musculoskeletal:     Cervical back: She exhibits decreased range of motion, tenderness (Throughout neck extending into  trapezius with significant spasm of left trapezius) and spasm. She exhibits no swelling, no edema and no pain.  Skin:    General: Skin is warm and dry.  Neurological:     General: No focal deficit present.     Mental Status: She is alert and oriented to person, place, and time.     Cranial Nerves: No cranial nerve deficit.     Motor: No weakness.     Coordination: Coordination normal.     Deep Tendon Reflexes: Reflexes normal.  Psychiatric:        Mood and Affect: Mood normal.        Behavior: Behavior normal.        Thought Content: Thought content normal.        Judgment: Judgment normal.    Acute Interface, Incoming Rad Results - 08/28/2018 12:37 PM EDT CLINICAL HISTORY: Cervicalgia  COMPARISON: None.  TECHNIQUE: Cervical spine series 4 views.  FINDINGS:   Advanced degenerative disc disease at C4-C5, with mild anterolisthesis of C4 over C5 which is degenerative in nature. Mild to moderate facet arthropathy, left greater than right.   No evidence of acute fracture or traumatic subluxation..    IMPRESSION: No acute fracture or subluxation. Degenerative changes as described above.  Electronically Signed by: Scot Jun  Assessment and Plan :   1. Degenerative disc disease, cervical   2. Neck pain   3. Trapezius muscle spasm     We will use IM Solu-Medrol, muscle relaxant.  Patient is to finish her oral steroid course as well.  Keep appointment with the spine specialist.  Strict ER precautions.   Jaynee Eagles, PA-C 09/01/18 1248

## 2018-09-01 NOTE — Discharge Instructions (Signed)
Make sure you hydrate well. Use 1-2 tablets of cyclobenzaprine for muscle relaxant. If you start to have weakness, inability to use your arms, severe neck pain despite treatment, head to the ER.

## 2018-10-02 ENCOUNTER — Other Ambulatory Visit: Payer: Self-pay | Admitting: Neurological Surgery

## 2018-10-02 DIAGNOSIS — M4722 Other spondylosis with radiculopathy, cervical region: Secondary | ICD-10-CM

## 2018-10-19 ENCOUNTER — Other Ambulatory Visit: Payer: Medicare Other

## 2018-10-26 ENCOUNTER — Ambulatory Visit
Admission: RE | Admit: 2018-10-26 | Discharge: 2018-10-26 | Disposition: A | Discharge status: Home / Self Care | Payer: Medicare Other | Source: Ambulatory Visit | Attending: Neurological Surgery | Admitting: Neurological Surgery

## 2018-10-26 ENCOUNTER — Other Ambulatory Visit: Payer: Self-pay

## 2018-10-26 DIAGNOSIS — M4722 Other spondylosis with radiculopathy, cervical region: Secondary | ICD-10-CM

## 2018-10-30 ENCOUNTER — Other Ambulatory Visit: Payer: Medicare Other

## 2018-11-12 ENCOUNTER — Ambulatory Visit (HOSPITAL_COMMUNITY): Payer: Medicare Other | Admitting: Psychiatry

## 2018-11-12 ENCOUNTER — Other Ambulatory Visit: Payer: Self-pay

## 2018-11-19 ENCOUNTER — Ambulatory Visit (HOSPITAL_COMMUNITY): Payer: Self-pay | Admitting: Psychiatry

## 2018-12-02 ENCOUNTER — Ambulatory Visit (HOSPITAL_COMMUNITY): Payer: Medicare Other | Admitting: Psychiatry

## 2018-12-19 ENCOUNTER — Encounter (HOSPITAL_COMMUNITY): Payer: Self-pay | Admitting: Psychiatry

## 2018-12-19 ENCOUNTER — Ambulatory Visit (INDEPENDENT_AMBULATORY_CARE_PROVIDER_SITE_OTHER): Payer: Medicare Other | Admitting: Psychiatry

## 2018-12-19 ENCOUNTER — Other Ambulatory Visit: Payer: Self-pay

## 2018-12-19 DIAGNOSIS — F4000 Agoraphobia, unspecified: Secondary | ICD-10-CM

## 2018-12-19 DIAGNOSIS — F419 Anxiety disorder, unspecified: Secondary | ICD-10-CM | POA: Diagnosis not present

## 2018-12-19 MED ORDER — FLUOXETINE HCL 10 MG PO CAPS: ORAL_CAPSULE | ORAL | 1 refills | Status: DC
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
## 2019-01-27 ENCOUNTER — Other Ambulatory Visit: Payer: Self-pay

## 2019-01-27 ENCOUNTER — Ambulatory Visit (INDEPENDENT_AMBULATORY_CARE_PROVIDER_SITE_OTHER): Payer: Medicare Other | Admitting: Psychiatry

## 2019-01-27 ENCOUNTER — Encounter (HOSPITAL_COMMUNITY): Payer: Self-pay | Admitting: Psychiatry

## 2019-01-27 DIAGNOSIS — F4001 Agoraphobia with panic disorder: Secondary | ICD-10-CM

## 2019-01-27 DIAGNOSIS — F410 Panic disorder [episodic paroxysmal anxiety] without agoraphobia: Secondary | ICD-10-CM | POA: Diagnosis not present

## 2019-01-27 MED ORDER — LORAZEPAM 0.5 MG PO TABS: ORAL_TABLET | ORAL | 0 refills | Status: DC

## 2019-01-27 MED ORDER — VENLAFAXINE HCL 37.5 MG PO TABS: ORAL_TABLET | ORAL | 1 refills | Status: DC

## 2019-01-27 NOTE — Progress Notes (Signed)
Virtual Visit via Telephone Note  I connected with Judith Mccullough on 01/27/19 at 11:00 AM EST by telephone and verified that I am speaking with the correct person using two identifiers.   I discussed the limitations, risks, security and privacy concerns of performing an evaluation and management service by telephone and the availability of in person appointments. I also discussed with the patient that there may be a patient responsible charge related to this service. The patient expressed understanding and agreed to proceed.   History of Present Illness: Patient was evaluated by phone session.  She is a 59 year old African-American female referred by PCP for the management of her anxiety symptoms.  Patient told that she tried Prozac but after 3 weeks of trying it did not help.  She continued to have anxiety and panic attacks.  She recall recent visit to the doctor's office maker very anxious and she had panic attack.  She reported these attacks as hyperventilating, sweating, palpitation and racing thoughts.  She also struggle with insomnia when she think about these attacks.  She lives with her husband who is supportive.  She had tried a lot of medication in the past including Lexapro, Zoloft, amitriptyline, Cymbalta and now Prozac with poor outcome.  Patient admitted occasional drinking but denies any intoxication, blackouts or any withdrawal symptoms.  She has a history of DUI in 50.  She reported because of these panic attacks she feels very nervous and anxious all the time.  She is cleared to go to the places specially crowded places, afraid to use the elevator and grocery stores.  She reported her energy level is fair.  Her appetite is okay.  Patient is on disability since 2016.   Past Psychiatric History: H/O anxiety since teens. No h/o inpatient treatment, suicidal attempt or seeing psychiatrist.  Pain specialist prescribed Zoloft, Lexapro, Cymbalta and amitriptyline to help shoulder pain  with poor outcome to help her anxiety. We tried Prozac with no help.  Psychiatric Specialty Exam: Physical Exam  Review of Systems  There were no vitals taken for this visit.There is no height or weight on file to calculate BMI.  General Appearance: NA  Eye Contact:  NA  Speech:  fast but coherent  Volume:  Normal  Mood:  Anxious and Dysphoric  Affect:  NA  Thought Process:  Descriptions of Associations: Intact  Orientation:  Full (Time, Place, and Person)  Thought Content:  Rumination  Suicidal Thoughts:  No  Homicidal Thoughts:  No  Memory:  Immediate;   Good Recent;   Fair Remote;   Fair  Judgement:  Fair  Insight:  Present  Psychomotor Activity:  NA  Concentration:  Concentration: Fair and Attention Span: Fair  Recall:  [employer] of Knowledge:  Good  Language:  Good  Akathisia:  No  Handed:  Right  AIMS (if indicated):     Assets:  Communication Skills Desire for Improvement Housing Resilience Social Support  ADL's:  Intact  Cognition:  WNL  Sleep:   fair      Assessment and Plan: Panic disorder with agoraphobia.  Panic attacks.  Patient tried Prozac up to 20 mg for 3 weeks but that did not help.  I will discontinue Prozac.  I strongly encouraged that she should see therapist since these days we are doing virtual therapy and she agreed.  Patient afraid to drive to come for the appointments.  We will schedule appointment virtually to see a therapist.  We will try Effexor which  she has never tried before.  I would also provide low-dose lorazepam to help these panic attacks however reminded them she should not drink alcohol as benzodiazepine interact with alcohol.  I also discussed benzodiazepine dependency, withdrawal and abuse.  She agreed with the plan.  If venlafaxine did not help we will consider GeneSight testing.  Follow-up in 4 to 6 weeks.  Follow Up Instructions:    I discussed the assessment and treatment plan with the patient. The patient was provided an  opportunity to ask questions and all were answered. The patient agreed with the plan and demonstrated an understanding of the instructions.   The patient was advised to call back or seek an in-person evaluation if the symptoms worsen or if the condition fails to improve as anticipated.  I provided 20 minutes of non-face-to-face time during this encounter.   Kathlee Nations, MD

## 2019-03-10 ENCOUNTER — Other Ambulatory Visit: Payer: Self-pay

## 2019-03-10 ENCOUNTER — Ambulatory Visit (INDEPENDENT_AMBULATORY_CARE_PROVIDER_SITE_OTHER): Payer: Medicare Other | Admitting: Psychiatry

## 2019-03-10 ENCOUNTER — Encounter (HOSPITAL_COMMUNITY): Payer: Self-pay | Admitting: Psychiatry

## 2019-03-10 DIAGNOSIS — F4001 Agoraphobia with panic disorder: Secondary | ICD-10-CM | POA: Diagnosis not present

## 2019-03-10 DIAGNOSIS — F410 Panic disorder [episodic paroxysmal anxiety] without agoraphobia: Secondary | ICD-10-CM

## 2019-03-10 MED ORDER — VENLAFAXINE HCL 75 MG PO TABS: 75.0000 mg | ORAL_TABLET | Freq: Every day | ORAL | 1 refills | Status: DC

## 2019-03-10 NOTE — Progress Notes (Signed)
Virtual Visit via Telephone Note  I connected with Judith Mccullough on 03/10/19 at 11:00 AM EST by telephone and verified that I am speaking with the correct person using two identifiers.   I discussed the limitations, risks, security and privacy concerns of performing an evaluation and management service by telephone and the availability of in person appointments. I also discussed with the patient that there may be a patient responsible charge related to this service. The patient expressed understanding and agreed to proceed.   History of Present Illness: Patient was evaluated by phone session.  On the last visit we started her on venlafaxine and she is doing better on venlafaxine.  She has not taken lorazepam because she has not left the house because of Covid.  She still have panic attacks and severe anxiety but they are not as intense and less frequent.  She lives with her husband who is supportive.  She still have some time sleep issues as she wakes up in the middle of the night but able to go back to sleep.  Recently she has not had any major panic attack which she usually described as hyperventilating, sweating, palpitation.  She believes it could be due to she has not left the house.  Recently her uncle died due to health complications.  She was sad because could not attend the funeral because of Covid.  Patient is not interested in therapy since she felt that medicine helping her.  She has no tremors, shakes or any EPS.  Her appetite is okay.  Her energy level is fair.   Past Psychiatric History: H/O anxiety since teens. No h/o inpatient treatment, suicidal attempt or seeing psychiatrist. Pain specialist prescribed Zoloft, Lexapro, Cymbalta and amitriptyline to help shoulder pain with poor outcome to help her anxiety. We tried Prozac with no help.   Psychiatric Specialty Exam: Physical Exam  Review of Systems  There were no vitals taken for this visit.There is no height or weight on  file to calculate BMI.  General Appearance: NA  Eye Contact:  NA  Speech:  fast but clear and coherrant  Volume:  Normal  Mood:  Anxious  Affect:  NA  Thought Process:  Descriptions of Associations: Intact  Orientation:  Full (Time, Place, and Person)  Thought Content:  WDL  Suicidal Thoughts:  No  Homicidal Thoughts:  No  Memory:  Immediate;   Good Recent;   Fair Remote;   Fair  Judgement:  Intact  Insight:  Present  Psychomotor Activity:  NA  Concentration:  Concentration: Fair and Attention Span: Fair  Recall:  [employer] of Knowledge:  Good  Language:  Good  Akathisia:  No  Handed:  Right  AIMS (if indicated):     Assets:  Communication Skills Desire for Improvement Housing Social Support  ADL's:  Intact  Cognition:  WNL  Sleep:   fair      Assessment and Plan: Panic disorder with agoraphobia.  Panic attacks.  Patient doing better with venlafaxine.  She does not want to increase the dose or change the medication.  She is also not interested in therapy since she felt it helped her a lot.  She does not need a new prescription of lorazepam since she has some leftover and she does not leave the house more frequently.  She does not like going to crowded places.  I will continue venlafaxine 75 mg daily.  Recommended to call us back if she has any question or any concern.  Follow-up in 2 months.  Follow Up Instructions:    I discussed the assessment and treatment plan with the patient. The patient was provided an opportunity to ask questions and all were answered. The patient agreed with the plan and demonstrated an understanding of the instructions.   The patient was advised to call back or seek an in-person evaluation if the symptoms worsen or if the condition fails to improve as anticipated.  I provided 20 minutes of non-face-to-face time during this encounter.   Kathlee Nations, MD

## 2019-03-19 ENCOUNTER — Ambulatory Visit (HOSPITAL_COMMUNITY): Admission: RE | Admit: 2019-03-19 | Payer: Medicare Other | Source: Ambulatory Visit

## 2019-03-19 ENCOUNTER — Other Ambulatory Visit: Payer: Self-pay | Admitting: Neurological Surgery

## 2019-03-19 ENCOUNTER — Other Ambulatory Visit (HOSPITAL_COMMUNITY): Payer: Self-pay | Admitting: Neurological Surgery

## 2019-03-19 ENCOUNTER — Encounter (HOSPITAL_COMMUNITY): Payer: Self-pay

## 2019-03-19 DIAGNOSIS — R519 Headache, unspecified: Secondary | ICD-10-CM

## 2019-05-08 ENCOUNTER — Telehealth (INDEPENDENT_AMBULATORY_CARE_PROVIDER_SITE_OTHER): Payer: Medicare Other | Admitting: Psychiatry

## 2019-05-08 ENCOUNTER — Other Ambulatory Visit: Payer: Self-pay

## 2019-05-08 DIAGNOSIS — F4001 Agoraphobia with panic disorder: Secondary | ICD-10-CM | POA: Diagnosis not present

## 2019-05-08 DIAGNOSIS — F410 Panic disorder [episodic paroxysmal anxiety] without agoraphobia: Secondary | ICD-10-CM | POA: Diagnosis not present

## 2019-05-08 MED ORDER — VENLAFAXINE HCL 75 MG PO TABS: 150.0000 mg | ORAL_TABLET | Freq: Every day | ORAL | 1 refills | Status: DC

## 2019-05-08 NOTE — Progress Notes (Signed)
Virtual Visit via Telephone Note  I connected with Judith Mccullough on 05/08/19 at 11:00 AM EDT by telephone and verified that I am speaking with the correct person using two identifiers.   I discussed the limitations, risks, security and privacy concerns of performing an evaluation and management service by telephone and the availability of in person appointments. I also discussed with the patient that there may be a patient responsible charge related to this service. The patient expressed understanding and agreed to proceed.   History of Present Illness: Patient was evaluated by phone session.  She endorsed that medicine seemed like stopped working.  She started to have again anxiety nervousness and yesterday she has a panic attack which she described hyperventilating, sweating and palpitation.  She is not taking lorazepam but compliant with venlafaxine 75 mg.  She sleeps fairly okay.  She lives with her husband.  She denies any crying spells or any feeling of hopelessness or worthlessness.  She gets very nervous and anxious around people.  Her energy level is fair.  She is not interested in therapy.   Past Psychiatric History: H/O anxiety since teens. No h/oinpatient treatment, suicidal attempt or seeing psychiatrist.Pain specialist prescribed Zoloft, Lexapro, Cymbalta and amitriptyline to help shoulder pain with poor outcome to help her anxiety.We tried Prozac with no help.    Psychiatric Specialty Exam: Physical Exam  Review of Systems  There were no vitals taken for this visit.There is no height or weight on file to calculate BMI.  General Appearance: NA  Eye Contact:  NA  Speech:  fast but coherrant  Volume:  Normal  Mood:  Anxious  Affect:  NA  Thought Process:  Descriptions of Associations: Intact  Orientation:  Full (Time, Place, and Person)  Thought Content:  Rumination  Suicidal Thoughts:  No  Homicidal Thoughts:  No  Memory:  Immediate;   Good Recent;    Fair Remote;   Fair  Judgement:  Intact  Insight:  Present  Psychomotor Activity:  NA  Concentration:  Concentration: Fair and Attention Span: Fair  Recall:  [employer] of Knowledge:  Fair  Language:  Fair  Akathisia:  No  Handed:  Right  AIMS (if indicated):     Assets:  Communication Skills Desire for Improvement Housing Social Support  ADL's:  Intact  Cognition:  WNL  Sleep:   fair      Assessment and Plan: Panic disorder with agoraphobia.  Panic attacks  I discussed that she should try higher dose of venlafaxine and after some discussion she agreed to give a try.  She is not interested in therapy even though we discussed in the past that therapy and medicine both work together.  She want to give out trial to higher dose of venlafaxine.  I also reminded that if she had a panic attack then she should take the lorazepam but she has not taking recently.  She agreed with the plan.  We will try venlafaxine 150 mg daily.  She has leftover lorazepam.  Recommended to call us back if is any question or any concern.  Follow-up in 2 months.   Follow Up Instructions:    I discussed the assessment and treatment plan with the patient. The patient was provided an opportunity to ask questions and all were answered. The patient agreed with the plan and demonstrated an understanding of the instructions.   The patient was advised to call back or seek an in-person evaluation if the symptoms worsen or if  the condition fails to improve as anticipated.  I provided 15 minutes of non-face-to-face time during this encounter.   Kathlee Nations, MD

## 2019-05-15 ENCOUNTER — Other Ambulatory Visit (HOSPITAL_COMMUNITY): Payer: Self-pay | Admitting: *Deleted

## 2019-05-15 DIAGNOSIS — F410 Panic disorder [episodic paroxysmal anxiety] without agoraphobia: Secondary | ICD-10-CM

## 2019-05-15 MED ORDER — LORAZEPAM 0.5 MG PO TABS: ORAL_TABLET | ORAL | 0 refills | Status: DC

## 2019-05-20 ENCOUNTER — Telehealth (HOSPITAL_COMMUNITY): Payer: Self-pay

## 2019-05-20 NOTE — Telephone Encounter (Signed)
Pt called with questions regarding increase in venlaxafine. Pt states she received 75mg  tablets and was confused about instructions. Writer clarified instructions to take two tablets daily for a total of 150mg . Pt able to verbalize instructions and denied further questions/concerns at this time.

## 2019-07-08 ENCOUNTER — Other Ambulatory Visit: Payer: Self-pay

## 2019-07-08 ENCOUNTER — Telehealth (HOSPITAL_COMMUNITY): Payer: Medicare Other | Admitting: Psychiatry

## 2019-07-10 ENCOUNTER — Encounter (HOSPITAL_COMMUNITY): Payer: Self-pay | Admitting: Psychiatry

## 2019-07-10 ENCOUNTER — Other Ambulatory Visit: Payer: Self-pay

## 2019-07-10 ENCOUNTER — Telehealth (INDEPENDENT_AMBULATORY_CARE_PROVIDER_SITE_OTHER): Payer: Medicare Other | Admitting: Psychiatry

## 2019-07-10 VITALS — Wt 223.0 lb

## 2019-07-10 DIAGNOSIS — F410 Panic disorder [episodic paroxysmal anxiety] without agoraphobia: Secondary | ICD-10-CM

## 2019-07-10 DIAGNOSIS — F4001 Agoraphobia with panic disorder: Secondary | ICD-10-CM | POA: Diagnosis not present

## 2019-07-10 MED ORDER — LORAZEPAM 0.5 MG PO TABS: ORAL_TABLET | ORAL | 0 refills | Status: DC

## 2019-07-10 MED ORDER — VENLAFAXINE HCL ER 150 MG PO CP24: 150.0000 mg | ORAL_CAPSULE | Freq: Every day | ORAL | 2 refills | Status: DC

## 2019-07-10 NOTE — Progress Notes (Signed)
Virtual Visit via Telephone Note  I connected with Judith Mccullough on 07/10/19 at 10:40 AM EDT by telephone and verified that I am speaking with the correct person using two identifiers.  Location: Patient: home Provider: home office   I discussed the limitations, risks, security and privacy concerns of performing an evaluation and management service by telephone and the availability of in person appointments. I also discussed with the patient that there may be a patient responsible charge related to this service. The patient expressed understanding and agreed to proceed.   History of Present Illness: Patient is evaluated by phone session.  We increased venlafaxine on the last visit 250 mg and she is feeling better.  She still have some time panic attack and nervousness when she go outside but overall she feels the medicine is working.  She lives with her husband.  She sleeps at least 6 to 7 hours.  She denies any recent crying spells or any feeling of hopelessness.  She does not believe the house unless it is important because she got nervous and anxious around people.  So far she has no side effects from the medication.  She endorsed chronic pain especially in the neck area and she takes muscle relaxant.   Past Psychiatric History: H/O anxiety since teens. No h/oinpatient treatment, suicidal attempt or seeing psychiatrist.Pain specialist prescribed Zoloft, Lexapro, Cymbalta and amitriptyline to help shoulder pain with poor outcome to help her anxiety.We tried Prozac with no help.   Psychiatric Specialty Exam: Physical Exam  Review of Systems  Musculoskeletal: Positive for neck pain.    Weight 223 lb (101.2 kg).There is no height or weight on file to calculate BMI.  General Appearance: NA  Eye Contact:  NA  Speech:  fast but coherrant  Volume:  Normal  Mood:  Anxious  Affect:  NA  Thought Process:  Descriptions of Associations: Intact  Orientation:  Full (Time, Place, and  Person)  Thought Content:  Rumination  Suicidal Thoughts:  No  Homicidal Thoughts:  No  Memory:  Immediate;   Fair Recent;   Fair Remote;   Fair  Judgement:  Intact  Insight:  Present  Psychomotor Activity:  NA  Concentration:  Concentration: Fair and Attention Span: Fair  Recall:  [employer] of Knowledge:  Good  Language:  Good  Akathisia:  No  Handed:  Right  AIMS (if indicated):     Assets:  Communication Skills Desire for Improvement Housing Social Support  ADL's:  Intact  Cognition:  WNL  Sleep:   6-7 hrs      Assessment and Plan: Panic disorder with agoraphobia.  Panic attacks.  Patient tolerating higher dose of medication.  She is also concerned about taking too many medication for her chronic health needs.  I recommend we can try Effexor extended release so she can take 1 pill in the morning.  She agreed with the plan.  We will also provide lorazepam 0.5 mg as she can take it for severe anxiety and panic attack.  Recommended to call us back if she has any question or any concern.  Follow-up in 3 months.  She is not interested in therapy.  Follow Up Instructions:    I discussed the assessment and treatment plan with the patient. The patient was provided an opportunity to ask questions and all were answered. The patient agreed with the plan and demonstrated an understanding of the instructions.   The patient was advised to call back or seek an  in-person evaluation if the symptoms worsen or if the condition fails to improve as anticipated.  I provided 20 minutes of non-face-to-face time during this encounter.   Kathlee Nations, MD

## 2019-08-05 ENCOUNTER — Other Ambulatory Visit: Payer: Self-pay

## 2019-08-05 ENCOUNTER — Encounter (HOSPITAL_COMMUNITY): Payer: Self-pay

## 2019-08-05 ENCOUNTER — Ambulatory Visit (HOSPITAL_COMMUNITY)
Admission: EM | Admit: 2019-08-05 | Discharge: 2019-08-05 | Disposition: A | Discharge status: Home / Self Care | Payer: Medicare Other | Attending: Physician Assistant | Admitting: Physician Assistant

## 2019-08-05 DIAGNOSIS — G8929 Other chronic pain: Secondary | ICD-10-CM

## 2019-08-05 DIAGNOSIS — M62838 Other muscle spasm: Secondary | ICD-10-CM

## 2019-08-05 DIAGNOSIS — M5412 Radiculopathy, cervical region: Secondary | ICD-10-CM

## 2019-08-05 DIAGNOSIS — M542 Cervicalgia: Secondary | ICD-10-CM

## 2019-08-05 HISTORY — DX: Other spondylosis with radiculopathy, cervical region: M47.22

## 2019-08-05 MED ORDER — PREDNISONE 10 MG PO TABS: ORAL_TABLET | ORAL | 0 refills | Status: AC

## 2019-08-05 MED ORDER — CYCLOBENZAPRINE HCL 5 MG PO TABS: 5.0000 mg | ORAL_TABLET | Freq: Three times a day (TID) | ORAL | 0 refills | Status: AC | PRN

## 2019-08-05 MED ORDER — DICLOFENAC SODIUM 1 % EX GEL: 2.0000 g | Freq: Four times a day (QID) | CUTANEOUS | 0 refills | Status: AC

## 2019-08-05 NOTE — ED Provider Notes (Signed)
[facility]    CSN: [id number] Arrival date & time: 08/05/19  1125      History   Chief Complaint Chief Complaint  Patient presents with  . neck/shoulder pain    HPI Judith Mccullough is a 59 y.o. female.   Patient with history of chronic neck pain with severe arthritic changes presents for chronic left sided neck pain. No changes in quality, only severity. She reports she can not see her neurosurgeon until August 28th. She reports baseline tingling in left hand, no increase. No weakness. Reports pain in left side of neck with movement. No chest pain or shortness of breath. Reports steroids and muscle relaxers have helped previously.        Past Medical History:  Diagnosis Date  . Anemia, unspecified 03/17/2011  . Anxiety   . Aortic regurgitation 03/17/2011   Pt. denies problem with her heart.  . Asthma   . Blood clot in vein   . Cervical spondylosis with radiculopathy   . Complex regional pain syndrome   . CVA (cerebral infarction)   . Depression 03/17/2011  . Diverticulitis   . Diverticulosis   . Hyperlipidemia 03/17/2011  . Hypertension   . Mitral valve regurgitation 03/17/2011   Pt. said she never had problem with her heart  . Peripheral neuropathy 03/17/2011   Pt. denies this problem  . Polysubstance abuse ([place]) 03/17/2011   positive cocaine 08/07/10 er visit; States she has never had problems with drugs.  . Renal insufficiency syndrome   . Unspecified asthma(493.90) 03/17/2011    Patient Active Problem List   Diagnosis Date Noted  . COPD with acute exacerbation ([place]) 02/21/2018  . Asthma exacerbation 02/20/2018  . CVA (cerebral infarction) 03/17/2011  . Unspecified asthma(493.90) 03/17/2011  . Preventative health care 03/17/2011  . Polysubstance abuse ([place]) 03/17/2011  . Hyperlipidemia 03/17/2011  . Anemia, unspecified 03/17/2011  . Aortic regurgitation 03/17/2011  . Mitral valve regurgitation 03/17/2011  . Depression 03/17/2011  . Peripheral  neuropathy 03/17/2011  . Hypertension   . Anxiety     Past Surgical History:  Procedure Laterality Date  . CHOLECYSTECTOMY    . KNEE ARTHROSCOPY Right 1992  . ORTHOPEDIC SURGERY    . thumb surgery Right 2003   R thumb reconstruction    OB History   No obstetric history on file.      Home Medications    Prior to Admission medications   Medication Sig Start Date End Date Taking? Authorizing Provider  acetaminophen (TYLENOL) 325 MG tablet Take 2 tablets (650 mg total) by mouth every 6 (six) hours as needed for moderate pain or fever. 02/21/18   Eugenie Filler, MD  albuterol (PROVENTIL HFA;VENTOLIN HFA) 108 (90 Base) MCG/ACT inhaler Inhale 2 puffs into the lungs every 4 (four) hours as needed for wheezing or shortness of breath. Use 3 times daily x 4 days then every 4 hours as needed. 02/21/18   Eugenie Filler, MD  amLODipine (NORVASC) 10 MG tablet Take 10 mg by mouth daily.      [provider]  aspirin EC 81 MG tablet Take 81 mg by mouth daily.    [provider]  cyclobenzaprine (FLEXERIL) 5 MG tablet Take 1 tablet (5 mg total) by mouth 3 (three) times daily as needed for up to 10 days for muscle spasms. 08/05/19 08/15/19  Hally Colella, Marguerita Beards, PA-C  diclofenac Sodium (VOLTAREN) 1 % GEL Apply 2 g topically 4 (four) times daily. 08/05/19   Ralf Konopka, Marguerita Beards,  PA-C  DYMISTA 137-50 MCG/ACT SUSP Place 1 spray into both nostrils 2 (two) times daily. 09/18/17   [provider]  LORazepam (ATIVAN) 0.5 MG tablet Take one tab as needed for severe panic attack 07/10/19   Arfeen, Arlyce Harman, MD  mometasone-formoterol (DULERA) 100-5 MCG/ACT AERO Inhale 2 puffs into the lungs 2 (two) times daily.    [provider]  montelukast (SINGULAIR) 10 MG tablet Take 10 mg by mouth every morning.    [provider]  pantoprazole (PROTONIX) 40 MG tablet Take 1 tablet (40 mg total) by mouth daily at 6 (six) AM. 02/22/18   Eugenie Filler, MD  predniSONE (DELTASONE) 10 MG tablet  Take 4 tablets (40 mg total) by mouth daily with breakfast for 2 days, THEN 2 tablets (20 mg total) daily with breakfast for 2 days, THEN 2 tablets (20 mg total) daily with breakfast for 2 days, THEN 1 tablet (10 mg total) daily with breakfast for 2 days. 08/05/19 08/13/19  Amauris Debois, Marguerita Beards, PA-C  rosuvastatin (CRESTOR) 10 MG tablet Take 10 mg by mouth daily.      [provider]  venlafaxine XR (EFFEXOR-XR) 150 MG 24 hr capsule Take 1 capsule (150 mg total) by mouth daily. 07/10/19 07/09/20  Kathlee Nations, MD    Family History Family History  Problem Relation Age of Onset  . Diabetes Mother   . Hypertension Mother   . Prostate cancer Father   . Hypertension Father   . Diabetes Father   . Breast cancer Paternal Aunt        x 2, ? ages of onset  . Breast cancer Maternal Aunt 60  . Breast cancer Maternal Aunt 38    Social History Social History   Tobacco Use  . Smoking status: Former Smoker    Types: Cigars  . Smokeless tobacco: Never Used  Substance Use Topics  . Alcohol use: Yes    Comment: occasional beer  . Drug use: No    Comment: THC & cocaine + @ 08-07-10 ER visit     Allergies   Cymbalta [duloxetine hcl], Klonopin [clonazepam], Codeine, and Latex   Review of Systems Review of Systems   Physical Exam Triage Vital Signs ED Triage Vitals  Enc Vitals Group     BP 08/05/19 1144 (!) 154/84     Pulse Rate 08/05/19 1144 (!) 116     Resp 08/05/19 1144 18     Temp 08/05/19 1144 98.4 F (36.9 C)     Temp Source 08/05/19 1144 Oral     SpO2 08/05/19 1144 96 %     Weight 08/05/19 1148 223 lb (101.2 kg)     Height 08/05/19 1148 5\' 8"  (1.727 m)     Head Circumference --      Peak Flow --      Pain Score 08/05/19 1148 9     Pain Loc --      Pain Edu? --      Excl. in [place]? --    No data found.  Updated Vital Signs BP (!) 154/84   Pulse (!) 116   Temp 98.4 F (36.9 C) (Oral)   Resp 18   Ht 5\' 8"  (1.727 m)   Wt 223 lb (101.2 kg)   SpO2 96%   BMI 33.91 kg/m    Visual Acuity Right Eye Distance:   Left Eye Distance:   Bilateral Distance:    Right Eye Near:   Left Eye Near:  Bilateral Near:     Physical Exam Vitals and nursing note reviewed.  Constitutional:      General: She is not in acute distress.    Appearance: She is well-developed. She is not ill-appearing.  HENT:     Head: Normocephalic and atraumatic.  Eyes:     Conjunctiva/sclera: Conjunctivae normal.  Neck:     Comments: TTP across left sided paraspinals into left trapezius. Spasm present in Left trapezius.  Pain with ROM to left, otherwise normal Cardiovascular:     Rate and Rhythm: Normal rate and regular rhythm.     Heart sounds: No murmur heard.   Pulmonary:     Effort: Pulmonary effort is normal. No respiratory distress.     Breath sounds: Normal breath sounds.  Abdominal:     Palpations: Abdomen is soft.     Tenderness: There is no abdominal tenderness.  Musculoskeletal:     Cervical back: Normal range of motion and neck supple. Tenderness present. No rigidity.  Lymphadenopathy:     Cervical: No cervical adenopathy.  Skin:    General: Skin is warm and dry.     Findings: No rash.  Neurological:     General: No focal deficit present.     Mental Status: She is alert and oriented to person, place, and time.     Motor: No weakness.     Coordination: Coordination normal.      UC Treatments / Results  Labs (all labs ordered are listed, but only abnormal results are displayed) Labs Reviewed - No data to display  EKG   Radiology No results found.  Procedures Procedures (including critical care time)  Medications Ordered in UC Medications - No data to display  Initial Impression / Assessment and Plan / UC Course  I have reviewed the triage vital signs and the nursing notes.  Pertinent labs & imaging results that were available during my care of the patient were reviewed by me and considered in my medical decision making (see chart for  details).     #Trapezius muscle spasm #Chronic neck pain #Cervical radiculopathy Patient is a 59 year old with chronic back pain presenting for continued chronic neck pain.  Spasm is present.  Patient is in no distress today.  No red flags today.  Neurologic symptoms are reported at baseline.  Discussed with patient per her PCP they wanted to limit number prednisone therapies prior to being seen again.  Discussed that we could do prednisone taper and muscle relaxer today but instructed her that she needs to see her primary care and that we could no longer do prednisone from the urgent care until she has proper evaluation with her primary care physician.  Instructed her to continue to follow-up with her neurosurgeon.  Patient verbalized understanding of this.  Strict emergency department precautions were discussed.  Patient verbalized understanding of these as well. Final Clinical Impressions(s) / UC Diagnoses   Final diagnoses:  Trapezius muscle spasm  Chronic neck pain  Cervical radiculopathy     Discharge Instructions     Take the medications as prescribed  Take muscle relaxer with caution for drowisness, do not drive, drink alcohol or operate machinery within 8 hours of taking  Call your PCP for follow up this week   Call your neurosurgeon for follow up ASAP  If severely worsening, fever, chest pain, shortness of breath, go to the Emergency Department      ED Prescriptions    Medication Sig Dispense Auth. Provider   cyclobenzaprine (FLEXERIL)  5 MG tablet Take 1 tablet (5 mg total) by mouth 3 (three) times daily as needed for up to 10 days for muscle spasms. 30 tablet Candas Deemer, Marguerita Beards, PA-C   predniSONE (DELTASONE) 10 MG tablet Take 4 tablets (40 mg total) by mouth daily with breakfast for 2 days, THEN 2 tablets (20 mg total) daily with breakfast for 2 days, THEN 2 tablets (20 mg total) daily with breakfast for 2 days, THEN 1 tablet (10 mg total) daily with breakfast for 2 days. 18  tablet Faithlynn Deeley, Marguerita Beards, PA-C   diclofenac Sodium (VOLTAREN) 1 % GEL Apply 2 g topically 4 (four) times daily. 100 g Taden Witter, Marguerita Beards, PA-C     I have reviewed the PDMP during this encounter.   Purnell Shoemaker, PA-C 08/05/19 1402

## 2019-08-05 NOTE — ED Triage Notes (Signed)
Pt c/o 9/10 constant pain in left neck/shoulder painx1 yr. Pt see a neurosurgeon, but they said they couldn't get her in until 8/28 and she's in severe pain. Pt states she was told she has cervical spondylosis w/facet arthropathy. Pt states left hand is numb.

## 2019-08-05 NOTE — Discharge Instructions (Signed)
Take the medications as prescribed  Take muscle relaxer with caution for drowisness, do not drive, drink alcohol or operate machinery within 8 hours of taking  Call your PCP for follow up this week   Call your neurosurgeon for follow up ASAP  If severely worsening, fever, chest pain, shortness of breath, go to the Emergency Department

## 2019-08-14 ENCOUNTER — Other Ambulatory Visit: Payer: Self-pay | Admitting: Nurse Practitioner

## 2019-08-14 DIAGNOSIS — Z1231 Encounter for screening mammogram for malignant neoplasm of breast: Secondary | ICD-10-CM

## 2019-09-02 ENCOUNTER — Ambulatory Visit
Admission: RE | Admit: 2019-09-02 | Discharge: 2019-09-02 | Disposition: A | Discharge status: Home / Self Care | Payer: Medicaid Other | Source: Ambulatory Visit | Attending: Nurse Practitioner | Admitting: Nurse Practitioner

## 2019-09-02 ENCOUNTER — Other Ambulatory Visit: Payer: Self-pay

## 2019-09-02 DIAGNOSIS — Z1231 Encounter for screening mammogram for malignant neoplasm of breast: Secondary | ICD-10-CM

## 2019-10-07 ENCOUNTER — Telehealth (INDEPENDENT_AMBULATORY_CARE_PROVIDER_SITE_OTHER): Payer: Medicare Other | Admitting: Psychiatry

## 2019-10-07 ENCOUNTER — Encounter (HOSPITAL_COMMUNITY): Payer: Self-pay | Admitting: Psychiatry

## 2019-10-07 ENCOUNTER — Other Ambulatory Visit: Payer: Self-pay

## 2019-10-07 VITALS — Wt 223.0 lb

## 2019-10-07 DIAGNOSIS — F410 Panic disorder [episodic paroxysmal anxiety] without agoraphobia: Secondary | ICD-10-CM | POA: Diagnosis not present

## 2019-10-07 DIAGNOSIS — F411 Generalized anxiety disorder: Secondary | ICD-10-CM | POA: Diagnosis not present

## 2019-10-07 MED ORDER — LORAZEPAM 0.5 MG PO TABS: ORAL_TABLET | ORAL | 0 refills | Status: DC

## 2019-10-07 MED ORDER — BUSPIRONE HCL 5 MG PO TABS: 5.0000 mg | ORAL_TABLET | Freq: Every day | ORAL | 2 refills | Status: DC

## 2019-10-07 NOTE — Progress Notes (Signed)
Virtual Visit via Telephone Note  I connected with Judith Mccullough on 10/07/19 at 10:20 AM EDT by telephone and verified that I am speaking with the correct person using two identifiers.  Location: Patient: home Provider: home office   I discussed the limitations, risks, security and privacy concerns of performing an evaluation and management service by telephone and the availability of in person appointments. I also discussed with the patient that there may be a patient responsible charge related to this service. The patient expressed understanding and agreed to proceed.   History of Present Illness: Patient is evaluated by phone session.  She had stopped the venlafaxine on her own because she felt it was too strong.  She did not call us before stopping the medicine.  We had increased venlafaxine from 75 mg to 150 mg.  Patient was having visible anxiety.  However she felt the dose was too strong and she stopped.  She is taking lorazepam only when she goes out and when she has severe panic attack.  She feels her anxiety is still there and she gets very nervous, anxious, feeling overwhelmed but denies any crying spells or any feeling of hopelessness.  She denies any suicidal thoughts.  She does not leave the house unless it is important because she gets very nervous and anxious around people.  She had tried many medication including Lexapro, Zoloft, Cymbalta, amitriptyline, Prozac and recently venlafaxine.  She recall gabapentin but did not provide the details and she told that she had read a lot of side effects and she is scared to take gabapentin.  Patient is reluctant to take medication because she reported her body is very sensitive but also like to have her symptoms get better.  She is not interested in therapy.  Patient has chronic pain and she takes medicine for that.  Her appetite is okay.  Her energy level is okay.  She is sleeping 5 to 6 hours.    Past Psychiatric History: H/O anxiety  since teens. No h/oinpatient treatment, suicidal attempt or seeing psychiatrist.Took Zoloft, Lexapro, Cymbalta and amitriptyline by other provider but no relief. We tried Prozac and Effexor but could not tolerate.     Psychiatric Specialty Exam: Physical Exam  Review of Systems  Weight 223 lb (101.2 kg).There is no height or weight on file to calculate BMI.  General Appearance: NA  Eye Contact:  NA  Speech:  fast but clear  Volume:  Increased  Mood:  Anxious  Affect:  NA  Thought Process:  Descriptions of Associations: Intact  Orientation:  Full (Time, Place, and Person)  Thought Content:  Rumination  Suicidal Thoughts:  No  Homicidal Thoughts:  No  Memory:  Immediate;   Fair Recent;   Fair Remote;   Fair  Judgement:  Fair  Insight:  Shallow  Psychomotor Activity:  NA  Concentration:  Concentration: Fair and Attention Span: Fair  Recall:  [employer] of Knowledge:  Fair  Language:  Fair  Akathisia:  No  Handed:  Right  AIMS (if indicated):     Assets:  Communication Skills Desire for Improvement Housing Social Support  ADL's:  Intact  Cognition:  WNL  Sleep:   5-7 hrs      Assessment and Plan: Generalized anxiety disorder.  Panic attacks.  Patient had stopped taking the venlafaxine on her own and reported the dose is too strong.  She did not provide much detail about the effect of venlafaxine.  She had tried a lot  of medication and stopped taking with the fear of side effects.  I discussed that she should try BuSpar but she has never tried before.  She agreed to give a trial.  She is taking lorazepam 0.5 mg only when she goes out in public.  She is not interested in therapy.  I explained BuSpar side effects and efficacy.  Recommended to call us back if she has any question or any concern.  She agreed to have a follow-up in 3 months.  Follow Up Instructions:    I discussed the assessment and treatment plan with the patient. The patient was provided an opportunity  to ask questions and all were answered. The patient agreed with the plan and demonstrated an understanding of the instructions.   The patient was advised to call back or seek an in-person evaluation if the symptoms worsen or if the condition fails to improve as anticipated.  I provided 21 minutes of non-face-to-face time during this encounter.   Kathlee Nations, MD

## 2019-10-10 ENCOUNTER — Telehealth (HOSPITAL_COMMUNITY): Payer: Medicare Other | Admitting: Psychiatry

## 2019-10-15 ENCOUNTER — Ambulatory Visit
Admission: RE | Admit: 2019-10-15 | Discharge: 2019-10-15 | Disposition: A | Discharge status: Home / Self Care | Payer: Medicaid Other | Source: Ambulatory Visit | Attending: Nurse Practitioner | Admitting: Nurse Practitioner

## 2019-10-15 ENCOUNTER — Other Ambulatory Visit: Payer: Self-pay

## 2019-12-29 ENCOUNTER — Other Ambulatory Visit (HOSPITAL_COMMUNITY): Payer: Self-pay | Admitting: Psychiatry

## 2019-12-29 DIAGNOSIS — F411 Generalized anxiety disorder: Secondary | ICD-10-CM

## 2020-01-01 ENCOUNTER — Telehealth (HOSPITAL_COMMUNITY): Payer: Medicaid Other | Admitting: Psychiatry

## 2020-01-01 ENCOUNTER — Other Ambulatory Visit: Payer: Self-pay

## 2020-01-02 ENCOUNTER — Other Ambulatory Visit (HOSPITAL_COMMUNITY): Payer: Self-pay | Admitting: *Deleted

## 2020-01-02 DIAGNOSIS — F411 Generalized anxiety disorder: Secondary | ICD-10-CM

## 2020-01-02 MED ORDER — BUSPIRONE HCL 5 MG PO TABS: 5.0000 mg | ORAL_TABLET | Freq: Every day | ORAL | 0 refills | Status: DC

## 2020-01-05 ENCOUNTER — Telehealth (HOSPITAL_COMMUNITY): Payer: Medicaid Other | Admitting: Psychiatry

## 2020-01-15 ENCOUNTER — Other Ambulatory Visit (HOSPITAL_COMMUNITY): Payer: Self-pay | Admitting: Psychiatry

## 2020-01-15 DIAGNOSIS — F410 Panic disorder [episodic paroxysmal anxiety] without agoraphobia: Secondary | ICD-10-CM

## 2020-02-11 ENCOUNTER — Other Ambulatory Visit (HOSPITAL_COMMUNITY): Payer: Self-pay | Admitting: Psychiatry

## 2020-02-11 DIAGNOSIS — F411 Generalized anxiety disorder: Secondary | ICD-10-CM

## 2020-02-13 ENCOUNTER — Other Ambulatory Visit: Payer: Self-pay

## 2020-02-13 ENCOUNTER — Telehealth (INDEPENDENT_AMBULATORY_CARE_PROVIDER_SITE_OTHER): Payer: Medicare Other | Admitting: Psychiatry

## 2020-02-13 ENCOUNTER — Encounter (HOSPITAL_COMMUNITY): Payer: Self-pay | Admitting: Psychiatry

## 2020-02-13 DIAGNOSIS — F410 Panic disorder [episodic paroxysmal anxiety] without agoraphobia: Secondary | ICD-10-CM | POA: Diagnosis not present

## 2020-02-13 DIAGNOSIS — F411 Generalized anxiety disorder: Secondary | ICD-10-CM

## 2020-02-13 MED ORDER — LORAZEPAM 0.5 MG PO TABS: ORAL_TABLET | ORAL | 0 refills | Status: AC

## 2020-02-13 MED ORDER — BUSPIRONE HCL 5 MG PO TABS: 5.0000 mg | ORAL_TABLET | Freq: Two times a day (BID) | ORAL | 2 refills | Status: DC

## 2020-02-13 NOTE — Progress Notes (Signed)
Virtual Visit via Telephone Note  I connected with Judith Mccullough on 02/13/20 at 10:00 AM EST by telephone and verified that I am speaking with the correct person using two identifiers.  Location: Patient: In Car Provider: Home Office   I discussed the limitations, risks, security and privacy concerns of performing an evaluation and management service by telephone and the availability of in person appointments. I also discussed with the patient that there may be a patient responsible charge related to this service. The patient expressed understanding and agreed to proceed.   History of Present Illness: Patient is evaluated by phone session.  She is on the phone by herself.  We started her on BuSpar on the last visit and she is taking most of the time.  She reported it is helping as she noticed less anxious but still she has residual anxiety and feeling overwhelmed.  She has few panic attacks and she takes Ativan when she go outside.  She denies any crying spells or any feeling of hopelessness.  She had upper respiratory infection few months ago when she was given antibiotics, steroids.  She is feeling better slowly and gradually.  I review her medication and blood work which was done in September.  Her hemoglobin A1c was 8.8 but patient told that she is no longer taking diabetes medication because she does not have a diabetes.  I clarify again if she is taking Metformin but she replied no because she does not have diabetes.  Her creatinine is 1.19 and it was done in September 2021.  She has neck pain and she is seen her primary care physician for neck pain management.  She sees Nelia Shi, NP who manages her medication.  She is willing to try higher dose of BuSpar since she noticed it helps her anxiety.  Her appetite is okay.  She reported her weight is stable.  She is sleeping at least 6 hours.  She reported no side effects from BuSpar.  Past Psychiatric History: H/O anxiety since teens. No  h/oinpatient treatment, suicidal attempt or seeing psychiatrist.Took Zoloft, Lexapro, Cymbalta and amitriptyline by other provider but no relief. We tried Prozac and Effexor but could not tolerate.    Psychiatric Specialty Exam: Physical Exam  Review of Systems  Weight 223 lb (101.2 kg).There is no height or weight on file to calculate BMI.  General Appearance: NA  Eye Contact:  NA  Speech:  fast  Volume:  Increased  Mood:  Anxious  Affect:  NA  Thought Process:  Descriptions of Associations: Circumstantial  Orientation:  Full (Time, Place, and Person)  Thought Content:  Rumination  Suicidal Thoughts:  No  Homicidal Thoughts:  No  Memory:  Immediate;   Good Recent;   Fair Remote;   Fair  Judgement:  Fair  Insight:  Shallow  Psychomotor Activity:  NA  Concentration:  Concentration: Fair and Attention Span: Fair  Recall:  [employer] of Knowledge:  Fair  Language:  Fair  Akathisia:  No  Handed:  Right  AIMS (if indicated):     Assets:  Communication Skills Desire for Improvement Housing Social Support Transportation  ADL's:  Intact  Cognition:  WNL  Sleep:   ok      Assessment and Plan: Generalized anxiety disorder.  Panic attacks.  I reviewed her current medication, blood work results which was done in September 2021.  Her creatinine is 1.19 and hemoglobin A1c 8.8.  However patient reported that she does not have a  diabetes and not taking any metformin.  I recommend she should discuss with her nurse practitioner Nelia Shi to clarify about her Metformin and should not stop since her hemoglobin A1c is high.  She agreed to check with her.  She is okay to try higher BuSpar dose since she feels little bit better and no side effects.  I will increase BuSpar 5 mg twice a day and lorazepam 0.5 mg to take only when she has a panic attack call if she go to public places.  She is not interested in therapy.  Recommended to call us back if is any question or any concern.   Follow-up in 3 months.  Follow Up Instructions:    I discussed the assessment and treatment plan with the patient. The patient was provided an opportunity to ask questions and all were answered. The patient agreed with the plan and demonstrated an understanding of the instructions.   The patient was advised to call back or seek an in-person evaluation if the symptoms worsen or if the condition fails to improve as anticipated.  I provided 18 minutes of non-face-to-face time during this encounter.   Kathlee Nations, MD

## 2020-03-10 ENCOUNTER — Telehealth (HOSPITAL_COMMUNITY): Payer: Self-pay | Admitting: *Deleted

## 2020-03-10 NOTE — Telephone Encounter (Signed)
Forms that pt dropped off from [employer], CMV Driver Medication Form, have been looked at by Dr. Adele Schilder and he is not comfortable filing these out as pt is on lorazepam. Forms will be forwarded on to Medical recoeds. Pt has signed a ROI giving permission.

## 2020-03-10 NOTE — Telephone Encounter (Signed)
She is taking medications than can cause sedation.

## 2020-03-11 ENCOUNTER — Ambulatory Visit (HOSPITAL_COMMUNITY): Payer: Self-pay

## 2020-03-30 ENCOUNTER — Ambulatory Visit: Payer: Self-pay

## 2020-03-30 ENCOUNTER — Ambulatory Visit (HOSPITAL_COMMUNITY): Payer: Self-pay

## 2020-03-31 ENCOUNTER — Telehealth (HOSPITAL_COMMUNITY): Payer: Self-pay

## 2020-03-31 NOTE — Telephone Encounter (Signed)
Patient called regarding the CMV Driver Medication Form that she dropped off in February. Due to provider not comfortable with filling them out, they were forwarded on to Medical Records per documentation from 03/10/20. I informed patient of this and gave her the number for her to contact Medical Records at [phone]

## 2020-04-06 ENCOUNTER — Telehealth (INDEPENDENT_AMBULATORY_CARE_PROVIDER_SITE_OTHER): Payer: Medicare Other | Admitting: Psychiatry

## 2020-04-06 ENCOUNTER — Encounter (HOSPITAL_COMMUNITY): Payer: Self-pay | Admitting: Psychiatry

## 2020-04-06 ENCOUNTER — Other Ambulatory Visit: Payer: Self-pay

## 2020-04-06 VITALS — Wt 213.0 lb

## 2020-04-06 DIAGNOSIS — F411 Generalized anxiety disorder: Secondary | ICD-10-CM

## 2020-04-06 DIAGNOSIS — F410 Panic disorder [episodic paroxysmal anxiety] without agoraphobia: Secondary | ICD-10-CM | POA: Diagnosis not present

## 2020-04-06 MED ORDER — BUSPIRONE HCL 5 MG PO TABS: 5.0000 mg | ORAL_TABLET | Freq: Two times a day (BID) | ORAL | 2 refills | Status: DC

## 2020-04-06 NOTE — Progress Notes (Signed)
Virtual Visit via Telephone Note  I connected with Judith Mccullough on 04/06/20 at  9:40 AM EDT by telephone and verified that I am speaking with the correct person using two identifiers.  Location: Patient: Home Provider: Home Office   I discussed the limitations, risks, security and privacy concerns of performing an evaluation and management service by telephone and the availability of in person appointments. I also discussed with the patient that there may be a patient responsible charge related to this service. The patient expressed understanding and agreed to proceed.   History of Present Illness: Patient is evaluated by phone session.  She is taking BuSpar 5 mg twice a day.  She noticed in the beginning improvement but lately still feels anxious.  She is no longer taking lorazepam.  She does not want to increase the medication because she feels she can manage her panic attacks.  She avoids taking elevators where she gets more nervous and anxious.  She also avoid public places.  She like to have DMV forms to be filled which is required to renew her license.  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  Patient told she has been driving for more than 30 years.  Patient told recently they have changed the policy and she need to disclose every medication on DMV form.  Patient sleeps okay but lately because of neck pain she is not sleeping all night.  She may have neck surgery in coming weeks.  Patient told she is not taking any diabetes medication because her last blood sugar was okay.  She also lost weight and trying to be more healthy.  She is more active.  She denies any crying spells or any feeling of hopelessness.  She denies any anhedonia.  Her energy level is good.    Past Psychiatric History: H/O anxiety since teens. No h/oinpatient treatment, suicidal attempt or seeing psychiatrist.TookZoloft, Lexapro, Cymbalta and amitriptylineby other provider but  no relief. Wetried Prozacand Effexor but could not tolerate.  Psychiatric Specialty Exam: Physical Exam  Review of Systems  Weight 213 lb (96.6 kg).Body mass index is 32.39 kg/m.  General Appearance: NA  Eye Contact:  NA  Speech:  fast  Volume:  Increased  Mood:  Anxious  Affect:  NA  Thought Process:  Descriptions of Associations: Circumstantial  Orientation:  Full (Time, Place, and Person)  Thought Content:  Logical  Suicidal Thoughts:  No  Homicidal Thoughts:  No  Memory:  Immediate;   Good Recent;   Good Remote;   Good  Judgement:  Intact  Insight:  Shallow  Psychomotor Activity:  NA  Concentration:  Concentration: Good and Attention Span: Good  Recall:  Good  Fund of Knowledge:  Good  Language:  Good  Akathisia:  No  Handed:  Right  AIMS (if indicated):     Assets:  Communication Skills Desire for Improvement Housing Resilience Talents/Skills  ADL's:  Intact  Cognition:  WNL  Sleep:   Fair because of neck pain      Assessment and Plan: Generalized anxiety disorder.  Panic attacks.  Patient like to keep the current dose of BuSpar 5 mg twice a day.  Even though she feels her medicine is not as effective but reluctant to increase the dose.  She is trying to manage her anxiety by avoiding going to public places and not taking elevators.  We have offered therapy but at this time patient does not feel she needed.  She like  to have DMV forms to be filled.  I explained psychotropic medication side effects specially sometimes they cause sedation, dizziness but so far patient does not have any side effects and she is comfortable.  We also talked about to clarify with her nurse practitioner about her blood sugar as she had stopped taking metformin.  Patient told she is checking her blood sugar every day and she also lost weight but promised that she will discuss with her nurse practitioner about stopping the Metformin.  I will continue BuSpar 5 mg twice a day.  She is not  taking Ativan.  Patient agreed to give [place] a call back if she agreed to start therapy.  Follow-up in 3 months.  We will complete DMV form.  Follow Up Instructions:    I discussed the assessment and treatment plan with the patient. The patient was provided an opportunity to ask questions and all were answered. The patient agreed with the plan and demonstrated an understanding of the instructions.   The patient was advised to call back or seek an in-person evaluation if the symptoms worsen or if the condition fails to improve as anticipated.  I provided 17 minutes of non-face-to-face time during this encounter.   Kathlee Nations, MD

## 2020-04-14 ENCOUNTER — Other Ambulatory Visit: Payer: Self-pay | Admitting: Neurological Surgery

## 2020-04-14 DIAGNOSIS — M5412 Radiculopathy, cervical region: Secondary | ICD-10-CM

## 2020-04-16 ENCOUNTER — Ambulatory Visit
Admission: RE | Admit: 2020-04-16 | Discharge: 2020-04-16 | Disposition: A | Discharge status: Home / Self Care | Payer: Medicare Other | Source: Ambulatory Visit | Attending: Neurological Surgery | Admitting: Neurological Surgery

## 2020-04-16 ENCOUNTER — Other Ambulatory Visit: Payer: Self-pay

## 2020-04-16 DIAGNOSIS — M5412 Radiculopathy, cervical region: Secondary | ICD-10-CM

## 2020-05-10 ENCOUNTER — Telehealth (HOSPITAL_COMMUNITY): Payer: Medicare Other | Admitting: Psychiatry

## 2020-06-04 ENCOUNTER — Ambulatory Visit (HOSPITAL_COMMUNITY): Payer: Self-pay

## 2020-06-18 ENCOUNTER — Other Ambulatory Visit (HOSPITAL_COMMUNITY): Payer: Self-pay | Admitting: Psychiatry

## 2020-06-18 DIAGNOSIS — F410 Panic disorder [episodic paroxysmal anxiety] without agoraphobia: Secondary | ICD-10-CM

## 2020-07-07 ENCOUNTER — Encounter (HOSPITAL_COMMUNITY): Payer: Self-pay | Admitting: Psychiatry

## 2020-07-07 ENCOUNTER — Other Ambulatory Visit: Payer: Self-pay

## 2020-07-07 ENCOUNTER — Telehealth (INDEPENDENT_AMBULATORY_CARE_PROVIDER_SITE_OTHER): Payer: Medicare Other | Admitting: Psychiatry

## 2020-07-07 DIAGNOSIS — F410 Panic disorder [episodic paroxysmal anxiety] without agoraphobia: Secondary | ICD-10-CM | POA: Diagnosis not present

## 2020-07-07 DIAGNOSIS — F411 Generalized anxiety disorder: Secondary | ICD-10-CM

## 2020-07-07 MED ORDER — BUSPIRONE HCL 5 MG PO TABS: 5.0000 mg | ORAL_TABLET | Freq: Two times a day (BID) | ORAL | 2 refills | Status: AC

## 2020-07-07 NOTE — Progress Notes (Signed)
Virtual Visit via Telephone Note  I connected with Judith Mccullough on 07/07/20 at 10:00 AM EDT by telephone and verified that I am speaking with the correct person using two identifiers.  Location: Patient: Home Provider: Home Office   I discussed the limitations, risks, security and privacy concerns of performing an evaluation and management service by telephone and the availability of in person appointments. I also discussed with the patient that there may be a patient responsible charge related to this service. The patient expressed understanding and agreed to proceed.   History of Present Illness: Patient is evaluated by phone session.  She is taking BuSpar 5 mg twice a day which is helping her anxiety.  She feels now started to take elevators up to 2-3 floor but is still not comfortable around public places.  Patient reported having anxiety but manageable but now realized that she need to see a therapist to help her coping skills.  Her DMV forms were renewed.  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  She sleeps some nights 4 to 5 hours because of the neck pain.  She had a neck surgery in April and slowly and gradually she is recovering.  Patient has not seen PCP in a while but she reported her blood sugar is okay.  Her appetite is okay.  Her weight is unchanged from the past.  Patient has not taking lorazepam for more than 6 months.  She denies any anhedonia, feeling of hopelessness or worthlessness.  Her energy level is good.  She denies any panic attack.    Past Psychiatric History: H/O anxiety since teens. No h/o inpatient treatment, suicidal attempt or seeing psychiatrist.  Took Zoloft, Lexapro, Cymbalta and amitriptyline by other provider but no relief. We tried Prozac and Effexor but could not tolerate.   Psychiatric Specialty Exam: Physical Exam  Review of Systems  Weight 210 lb (95.3 kg).There is no height or weight on file to calculate BMI.  General  Appearance: NA  Eye Contact:  NA  Speech:  Clear and Coherent and fast  Volume:  Normal  Mood:  Anxious  Affect:  NA  Thought Process:  Descriptions of Associations: Intact  Orientation:  Full (Time, Place, and Person)  Thought Content:  Logical  Suicidal Thoughts:  No  Homicidal Thoughts:  No  Memory:  Immediate;   Good Recent;   Good Remote;   Good  Judgement:  Fair  Insight:  Shallow  Psychomotor Activity:  NA  Concentration:  Concentration: Good and Attention Span: Good  Recall:  Good  Fund of Knowledge:  Good  Language:  Good  Akathisia:  No  Handed:  Right  AIMS (if indicated):     Assets:  Communication Skills Desire for Improvement Housing Resilience Talents/Skills Transportation  ADL's:  Intact  Cognition:  WNL  Sleep:   fair, still neck pain      Assessment and Plan: Generalized anxiety disorder.  Panic attacks.  Patient like to keep the current dose of BuSpar 5 mg twice a day.  However she agree to see a therapist to help her coping skills as she still have anxiety when she goes to crowded places.  She is recovering from her neck surgery slowly and gradually.  I encouraged to keep appointment with the PCP and had blood work as patient is no longer taking metformin in a while.  Recommended to call us back if she has any question or any concern.  Follow-up  in 3 months.  Follow Up Instructions:    I discussed the assessment and treatment plan with the patient. The patient was provided an opportunity to ask questions and all were answered. The patient agreed with the plan and demonstrated an understanding of the instructions.   The patient was advised to call back or seek an in-person evaluation if the symptoms worsen or if the condition fails to improve as anticipated.  I provided 17 minutes of non-face-to-face time during this encounter.   Kathlee Nations, MD

## 2020-07-13 ENCOUNTER — Ambulatory Visit (HOSPITAL_COMMUNITY): Payer: Medicare Other | Admitting: Licensed Clinical Social Worker

## 2020-07-15 ENCOUNTER — Ambulatory Visit (HOSPITAL_COMMUNITY): Payer: Medicare Other

## 2020-07-16 ENCOUNTER — Ambulatory Visit (HOSPITAL_COMMUNITY)
Admission: RE | Admit: 2020-07-16 | Discharge: 2020-07-16 | Disposition: A | Discharge status: Home / Self Care | Payer: Medicare Other | Source: Ambulatory Visit | Attending: Family Medicine | Admitting: Family Medicine

## 2020-07-16 ENCOUNTER — Encounter (HOSPITAL_COMMUNITY): Payer: Self-pay

## 2020-07-16 ENCOUNTER — Other Ambulatory Visit: Payer: Self-pay

## 2020-07-16 VITALS — BP 133/97 | HR 108 | Temp 99.2°F | Resp 18

## 2020-07-16 DIAGNOSIS — J4531 Mild persistent asthma with (acute) exacerbation: Secondary | ICD-10-CM | POA: Diagnosis not present

## 2020-07-16 DIAGNOSIS — R000 Tachycardia, unspecified: Secondary | ICD-10-CM

## 2020-07-16 DIAGNOSIS — R062 Wheezing: Secondary | ICD-10-CM

## 2020-07-16 MED ORDER — ALBUTEROL SULFATE HFA 108 (90 BASE) MCG/ACT IN AERS: 1.0000 | INHALATION_SPRAY | Freq: Four times a day (QID) | RESPIRATORY_TRACT | 0 refills | Status: AC | PRN

## 2020-07-16 MED ORDER — BUDESONIDE-FORMOTEROL FUMARATE 160-4.5 MCG/ACT IN AERO: 2.0000 | INHALATION_SPRAY | Freq: Two times a day (BID) | RESPIRATORY_TRACT | 0 refills | Status: AC

## 2020-07-16 MED ORDER — DEXAMETHASONE SODIUM PHOSPHATE 10 MG/ML IJ SOLN
INTRAMUSCULAR | Status: AC
  Filled 2020-07-16: qty 1

## 2020-07-16 MED ORDER — DEXAMETHASONE SODIUM PHOSPHATE 10 MG/ML IJ SOLN
10.0000 mg | Freq: Once | INTRAMUSCULAR | Status: AC
  Administered 2020-07-16: 10 mg via INTRAMUSCULAR

## 2020-07-16 NOTE — ED Provider Notes (Addendum)
[facility]    CSN: [id number] Arrival date & time: 07/16/20  1035      History   Chief Complaint Chief Complaint  Patient presents with   Appointment   Wheezing   Cough    HPI Judith Mccullough is a 60 y.o. female.   Presenting today with 2-day history of chest tightness, wheezing, coughing, shortness of breath.  Denies congestion, sore throat, fever, chills, body aches, abdominal pain, nausea vomiting diarrhea, palpitations, leg swelling, sick contacts.  Has taken 2 negative home COVID tests since onset of symptoms.  History of asthma, on Singulair, albuterol inhaler and neb which she states helps for short periods of time.  States this feels consistent with past asthma exacerbations.  Also has a past medical history of COPD, polysubstance abuse.  Past Medical History:  Diagnosis Date   Anemia, unspecified 03/17/2011   Anxiety    Aortic regurgitation 03/17/2011   Pt. denies problem with her heart.   Asthma    Blood clot in vein    Cervical spondylosis with radiculopathy    Complex regional pain syndrome    CVA (cerebral infarction)    Depression 03/17/2011   Diverticulitis    Diverticulosis    Hyperlipidemia 03/17/2011   Hypertension    Mitral valve regurgitation 03/17/2011   Pt. said she never had problem with her heart   Peripheral neuropathy 03/17/2011   Pt. denies this problem   Polysubstance abuse ([place]) 03/17/2011   positive cocaine 08/07/10 er visit; States she has never had problems with drugs.   Renal insufficiency syndrome    Unspecified asthma(493.90) 03/17/2011   Patient Active Problem List   Diagnosis Date Noted   COPD with acute exacerbation ([place]) 02/21/2018   Asthma exacerbation 02/20/2018   CVA (cerebral infarction) 03/17/2011   Unspecified asthma(493.90) 03/17/2011   Preventative health care 03/17/2011   Polysubstance abuse ([place]) 03/17/2011   Hyperlipidemia 03/17/2011   Anemia, unspecified 03/17/2011   Aortic regurgitation 03/17/2011   Mitral  valve regurgitation 03/17/2011   Depression 03/17/2011   Peripheral neuropathy 03/17/2011   Hypertension    Anxiety    Past Surgical History:  Procedure Laterality Date   CHOLECYSTECTOMY     KNEE ARTHROSCOPY Right 1992   ORTHOPEDIC SURGERY     thumb surgery Right 2003   R thumb reconstruction   OB History   No obstetric history on file.      Home Medications    Prior to Admission medications   Medication Sig Start Date End Date Taking? Authorizing Provider  albuterol (VENTOLIN HFA) 108 (90 Base) MCG/ACT inhaler Inhale 1-2 puffs into the lungs every 6 (six) hours as needed for wheezing or shortness of breath. 07/16/20  Yes Volney American, PA-C  budesonide-formoterol [facility]) 160-4.5 MCG/ACT inhaler Inhale 2 puffs into the lungs in the morning and at bedtime. 07/16/20  Yes Volney American, PA-C  acetaminophen (TYLENOL) 325 MG tablet Take 2 tablets (650 mg total) by mouth every 6 (six) hours as needed for moderate pain or fever. 02/21/18   Eugenie Filler, MD  amLODipine (NORVASC) 10 MG tablet Take 10 mg by mouth daily.    [provider]  aspirin EC 81 MG tablet Take 81 mg by mouth daily.    [provider]  busPIRone (BUSPAR) 5 MG tablet Take 1 tablet (5 mg total) by mouth 2 (two) times daily. 07/07/20 07/07/21  Arfeen, Arlyce Harman, MD  diclofenac Sodium (VOLTAREN) 1 % GEL Apply 2 g topically 4 (four)  times daily. 08/05/19   Darr, Edison Nasuti, PA-C  LORazepam (ATIVAN) 0.5 MG tablet TAKE 1 TABLET BY MOUTH AS NEEDED FOR SEVERE PANIC ATTACK Patient not taking: Reported on 04/06/2020 02/13/20   Arfeen, Arlyce Harman, MD  montelukast (SINGULAIR) 10 MG tablet Take 10 mg by mouth every morning.    [provider]  pantoprazole (PROTONIX) 40 MG tablet Take 1 tablet (40 mg total) by mouth daily at 6 (six) AM. Patient not taking: Reported on 04/06/2020 02/22/18   Eugenie Filler, MD  rosuvastatin (CRESTOR) 10 MG tablet Take 10 mg by mouth daily.    [provider]    Family History Family History  Problem Relation Age of Onset   Diabetes Mother    Hypertension Mother    Prostate cancer Father    Hypertension Father    Diabetes Father    Breast cancer Paternal Aunt        x 2, ? ages of onset   Breast cancer Maternal Aunt 60   Breast cancer Maternal Aunt 38    Social History Social History   Tobacco Use   Smoking status: Former    Pack years: 0.00    Types: Cigars   Smokeless tobacco: Never  Substance Use Topics   Alcohol use: Yes    Comment: occasional beer   Drug use: No    Comment: THC & cocaine + @ 08-07-10 ER visit     Allergies   Cymbalta [duloxetine hcl], Klonopin [clonazepam], Codeine, and Latex   Review of Systems Review of Systems Per HPI  Physical Exam Triage Vital Signs ED Triage Vitals  Enc Vitals Group     BP 07/16/20 1105 (!) 133/97     Pulse Rate 07/16/20 1102 (!) 108     Resp 07/16/20 1102 18     Temp 07/16/20 1105 99.2 F (37.3 C)     Temp Source 07/16/20 1105 Oral     SpO2 07/16/20 1102 92 %     Weight --      Height --      Head Circumference --      Peak Flow --      Pain Score 07/16/20 1104 6     Pain Loc --      Pain Edu? --      Excl. in [place]? --    No data found.  Updated Vital Signs BP (!) 133/97   Pulse (!) 108   Temp 99.2 F (37.3 C) (Oral)   Resp 18   LMP  (Exact Date)   SpO2 92%   Visual Acuity Right Eye Distance:   Left Eye Distance:   Bilateral Distance:    Right Eye Near:   Left Eye Near:    Bilateral Near:     Physical Exam Vitals and nursing note reviewed.  Constitutional:      Appearance: Normal appearance. She is not ill-appearing.  HENT:     Head: Atraumatic.     Right Ear: Tympanic membrane normal.     Left Ear: Tympanic membrane normal.     Nose: Nose normal.     Mouth/Throat:     Mouth: Mucous membranes are moist.     Pharynx: Oropharynx is clear. No oropharyngeal exudate.  Eyes:     Extraocular Movements: Extraocular movements intact.      Conjunctiva/sclera: Conjunctivae normal.  Cardiovascular:     Rate and Rhythm: Normal rate and regular rhythm.     Heart sounds: Normal heart sounds.  Pulmonary:  Effort: Pulmonary effort is normal. No respiratory distress.     Breath sounds: Wheezing present. No rales.     Comments: Moderate diffuse wheezes bilaterally Abdominal:     General: Bowel sounds are normal. There is no distension.     Palpations: Abdomen is soft.     Tenderness: There is no abdominal tenderness. There is no guarding.  Musculoskeletal:        General: Normal range of motion.     Cervical back: Normal range of motion and neck supple.     Right lower leg: No edema.     Left lower leg: No edema.     Comments: - homans sign b/l LEs  Skin:    General: Skin is warm and dry.  Neurological:     Mental Status: She is alert and oriented to person, place, and time.  Psychiatric:        Mood and Affect: Mood normal.        Thought Content: Thought content normal.        Judgment: Judgment normal.   UC Treatments / Results  Labs (all labs ordered are listed, but only abnormal results are displayed) Labs Reviewed - No data to display  EKG  Radiology No results found.  Procedures Procedures (including critical care time)  Medications Ordered in UC Medications  dexamethasone (DECADRON) injection 10 mg (10 mg Intramuscular Given 07/16/20 1132)    Initial Impression / Assessment and Plan / UC Course  I have reviewed the triage vital signs and the nursing notes.  Pertinent labs & imaging results that were available during my care of the patient were reviewed by me and considered in my medical decision making (see chart for details).     Mildly tachycardic in triage and O2 saturation 92%, but overall appears well and in no distress.  Moderate diffuse wheezes on exam.  Will give IM Decadron in clinic for suspected asthma/COPD exacerbation and will send home on Symbicort in addition to her albuterol Inhaler  and nebulizer.  Strict return precautions given for acutely worsening symptoms at any time.  She declines COVID retesting today.  Final Clinical Impressions(s) / UC Diagnoses   Final diagnoses:  Mild persistent asthma with exacerbation  Wheezing  Tachycardia   Discharge Instructions   None    ED Prescriptions     Medication Sig Dispense Auth. Provider   budesonide-formoterol (SYMBICORT) 160-4.5 MCG/ACT inhaler Inhale 2 puffs into the lungs in the morning and at bedtime. 1 each Volney American, PA-C   albuterol (VENTOLIN HFA) 108 (90 Base) MCG/ACT inhaler Inhale 1-2 puffs into the lungs every 6 (six) hours as needed for wheezing or shortness of breath. 18 g Volney American, [place]      PDMP not reviewed this encounter.   Volney American, [place] 07/16/20 [place], [place], [place] 07/16/20 1314

## 2020-07-16 NOTE — ED Triage Notes (Signed)
Pt c/o having asthma attacks x 2 days.   States she has been wheezing and coughing. Pt states she took an at home COVID test that resulted negative.   States she has an inhaler at home but has not used it this morning.

## 2020-07-21 ENCOUNTER — Ambulatory Visit (HOSPITAL_COMMUNITY): Payer: Medicare Other | Admitting: Licensed Clinical Social Worker

## 2020-07-28 ENCOUNTER — Ambulatory Visit (HOSPITAL_COMMUNITY): Payer: Self-pay | Admitting: Licensed Clinical Social Worker

## 2020-08-05 ENCOUNTER — Ambulatory Visit (HOSPITAL_COMMUNITY): Payer: Medicare Other | Admitting: Licensed Clinical Social Worker

## 2020-08-05 ENCOUNTER — Other Ambulatory Visit: Payer: Self-pay

## 2020-08-05 ENCOUNTER — Encounter (HOSPITAL_COMMUNITY): Payer: Self-pay

## 2020-08-21 ENCOUNTER — Other Ambulatory Visit: Payer: Self-pay | Admitting: Family Medicine

## 2020-08-21 NOTE — Telephone Encounter (Signed)
Pt no longer prescriber's care- pt seen at [facility]

## 2020-09-01 ENCOUNTER — Ambulatory Visit (HOSPITAL_COMMUNITY): Payer: Self-pay

## 2020-10-07 ENCOUNTER — Telehealth (HOSPITAL_COMMUNITY): Payer: Medicare Other | Admitting: Psychiatry

## 2020-11-02 ENCOUNTER — Other Ambulatory Visit (HOSPITAL_COMMUNITY): Payer: Self-pay | Admitting: Psychiatry

## 2020-11-02 DIAGNOSIS — F410 Panic disorder [episodic paroxysmal anxiety] without agoraphobia: Secondary | ICD-10-CM

## 2020-11-08 ENCOUNTER — Ambulatory Visit (HOSPITAL_COMMUNITY): Payer: Self-pay

## 2020-11-08 ENCOUNTER — Ambulatory Visit: Payer: Self-pay

## 2020-11-09 ENCOUNTER — Ambulatory Visit: Payer: Self-pay

## 2020-11-15 ENCOUNTER — Emergency Department (HOSPITAL_COMMUNITY)
Admission: EM | Admit: 2020-11-15 | Discharge: 2020-11-16 | Disposition: E | Payer: Medicare Other | Attending: Emergency Medicine | Admitting: Emergency Medicine

## 2020-11-15 DIAGNOSIS — Z87891 Personal history of nicotine dependence: Secondary | ICD-10-CM | POA: Insufficient documentation

## 2020-11-15 DIAGNOSIS — Z7982 Long term (current) use of aspirin: Secondary | ICD-10-CM | POA: Insufficient documentation

## 2020-11-15 DIAGNOSIS — Z7951 Long term (current) use of inhaled steroids: Secondary | ICD-10-CM | POA: Diagnosis not present

## 2020-11-15 DIAGNOSIS — J45909 Unspecified asthma, uncomplicated: Secondary | ICD-10-CM | POA: Insufficient documentation

## 2020-11-15 DIAGNOSIS — Z79899 Other long term (current) drug therapy: Secondary | ICD-10-CM | POA: Diagnosis not present

## 2020-11-15 DIAGNOSIS — I10 Essential (primary) hypertension: Secondary | ICD-10-CM | POA: Diagnosis not present

## 2020-11-15 DIAGNOSIS — Z91040 Latex allergy status: Secondary | ICD-10-CM | POA: Insufficient documentation

## 2020-11-15 DIAGNOSIS — Z8673 Personal history of transient ischemic attack (TIA), and cerebral infarction without residual deficits: Secondary | ICD-10-CM

## 2020-11-15 DIAGNOSIS — I469 Cardiac arrest, cause unspecified: Secondary | ICD-10-CM | POA: Diagnosis present

## 2020-11-15 DIAGNOSIS — Z8709 Personal history of other diseases of the respiratory system: Secondary | ICD-10-CM

## 2020-11-15 DIAGNOSIS — Z8639 Personal history of other endocrine, nutritional and metabolic disease: Secondary | ICD-10-CM

## 2020-11-15 DIAGNOSIS — Z8679 Personal history of other diseases of the circulatory system: Secondary | ICD-10-CM

## 2020-11-15 MED ORDER — CALCIUM CHLORIDE 10 % IV SOLN
INTRAVENOUS | Status: AC | PRN
  Administered 2020-11-15: 1 g via INTRAVENOUS

## 2020-11-15 MED ORDER — SODIUM BICARBONATE 8.4 % IV SOLN
INTRAVENOUS | Status: AC | PRN
  Administered 2020-11-15: 100 meq via INTRAVENOUS

## 2020-11-15 MED ORDER — EPINEPHRINE 1 MG/10ML IJ SOSY
PREFILLED_SYRINGE | INTRAMUSCULAR | Status: AC | PRN
  Administered 2020-11-15 (×3): 1 mg via INTRAVENOUS

## 2020-11-16 NOTE — ED Provider Notes (Signed)
[facility] EM[facility]ENC[facility] DEPARTMENT Provider Note  CSN: [id number] Arrival date & time: 11/17/20 1414    History No chief complaint on file.   Judith Mccullough is a 60 y.o. female brought to the ED via EMS in cardiac arrest. She was reportedly found slumped over [place] of the [place] with her inhaler in her hand unresponsive and pulseless. Unknown down time. First responders began CPR, EMS arrived, placed a [facility] [place] and gave her 3 epi enroute. She had a change from asystole to PEA but never regained pulses. Level 5 caveat applies.    Past Medical History:  Diagnosis Date   Anemia, unspecified 03/17/2011   Anxiety    Aortic regurgitation 03/17/2011   Pt. denies problem with her heart.   Asthma    Blood clot in vein    Cervical spondylosis with radiculopathy    Complex regional pain syndrome    CVA (cerebral infarction)    Depression 03/17/2011   Diverticulitis    Diverticulosis    Hyperlipidemia 03/17/2011   Hypertension    Mitral valve regurgitation 03/17/2011   Pt. said she never had problem with her heart   Peripheral neuropathy 03/17/2011   Pt. denies this problem   Polysubstance abuse ([place]) 03/17/2011   positive cocaine 08/07/10 er visit; States she has never had problems with drugs.   Renal insufficiency syndrome    Unspecified asthma(493.90) 03/17/2011    Past Surgical History:  Procedure Laterality Date   CHOLECYSTECTOMY     KNEE ARTHROSCOPY Right 1992   ORTHOPEDIC SURGERY     thumb surgery Right 2003   R thumb reconstruction    Family History  Problem Relation Age of Onset   Diabetes Mother    Hypertension Mother    Prostate cancer Father    Hypertension Father    Diabetes Father    Breast cancer Paternal Aunt        x 2, ? ages of onset   Breast cancer Maternal Aunt 60   Breast cancer Maternal Aunt 38    Social History   Tobacco Use   Smoking status: Former    Types: Cigars   Smokeless tobacco: Never  Substance Use Topics   Alcohol  use: Yes    Comment: occasional beer   Drug use: No    Comment: THC & cocaine + @ 08-07-10 ER visit     Home Medications Prior to Admission medications   Medication Sig Start Date End Date Taking? Authorizing Provider  acetaminophen (TYLENOL) 325 MG tablet Take 2 tablets (650 mg total) by mouth every 6 (six) hours as needed for moderate pain or fever. 02/21/18   Eugenie Filler, MD  albuterol (VENTOLIN HFA) 108 (90 Base) MCG/ACT inhaler Inhale 1-2 puffs into the lungs every 6 (six) hours as needed for wheezing or shortness of breath. 07/16/20   Volney American, PA-C  amLODipine (NORVASC) 10 MG tablet Take 10 mg by mouth daily.    [provider]  aspirin EC 81 MG tablet Take 81 mg by mouth daily.    [provider]  budesonide-formoterol (SYMBICORT) 160-4.5 MCG/ACT inhaler Inhale 2 puffs into the lungs in the morning and at bedtime. 07/16/20   Volney American, PA-C  busPIRone (BUSPAR) 5 MG tablet Take 1 tablet (5 mg total) by mouth 2 (two) times daily. 07/07/20 07/07/21  Arfeen, Arlyce Harman, MD  diclofenac Sodium (VOLTAREN) 1 % GEL Apply 2 g topically 4 (four) times daily. 08/05/19  Darr, Edison Nasuti, PA-C  LORazepam (ATIVAN) 0.5 MG tablet TAKE 1 TABLET BY MOUTH AS NEEDED FOR SEVERE PANIC ATTACK Patient not taking: Reported on 04/06/2020 02/13/20   Arfeen, Arlyce Harman, MD  montelukast (SINGULAIR) 10 MG tablet Take 10 mg by mouth every morning.    [provider]  pantoprazole (PROTONIX) 40 MG tablet Take 1 tablet (40 mg total) by mouth daily at 6 (six) AM. Patient not taking: Reported on 04/06/2020 02/22/18   Eugenie Filler, MD  rosuvastatin (CRESTOR) 10 MG tablet Take 10 mg by mouth daily.    [provider]     Allergies    Cymbalta [duloxetine hcl], Klonopin [clonazepam], Codeine, and Latex   Review of Systems   Review of Systems Unable to assess due to mental status.    Physical Exam BP (!) 154/12   Resp (!) 38   Ht 5\' 8"  (1.727 m)   Wt 95 kg    LMP 08/11/2017   BMI 31.84 kg/m   Physical Exam Vitals and nursing note reviewed.  Constitutional:      Appearance: Normal appearance.  HENT:     Head: Normocephalic and atraumatic.     Nose: Nose normal.  Eyes:     Extraocular Movements: Extraocular movements intact.     Conjunctiva/sclera: Conjunctivae normal.     Comments: Pupils fixed and dilated  Cardiovascular:     Comments: Femoral pulses with chest compressions only Pulmonary:     Comments: [place], breath sounds equal Abdominal:     General: There is distension.  Musculoskeletal:        General: No swelling.     Cervical back: Neck supple.  Skin:    General: Skin is warm.  Neurological:     Comments: unresponsive  Psychiatric:     Comments: unresponsive     ED Results / Procedures / Treatments   Labs (all labs ordered are listed, but only abnormal results are displayed) Labs Reviewed - No data to display  EKG None  Radiology No results found.  Procedures Procedures  Medications Ordered in the ED Medications  EPINEPHrine (ADRENALIN) 1 MG/10ML injection (1 mg Intravenous Given Dec 09, 2020 1421)  sodium bicarbonate injection (100 mEq Intravenous Given December 09, 2020 1416)  calcium chloride injection (1 g Intravenous Given 12-09-20 1416)     MDM Rules/Calculators/A&P MDM Patient arrives in cardiac arrest after multiple rounds of epi. Given additional epi, calcium and bicarb without ROSC. Bedside US shows no cardiac motion. Additional efforts deemed futile and patient was pronounced dead at 1425hrs.  No family available yet. Patient's available EMR reviewed, remote history of reported cocaine use in 2012 non recently. She had video visit to PCP recently for URI symptoms (10/19/20) given Rx for cough syrup and prednisone.    ED Course  I have reviewed the triage vital signs and the nursing notes.  Pertinent labs & imaging results that were available during my care of the patient were reviewed by me and  considered in my medical decision making (see chart for details).  Clinical Course as of 2020/12/09 1545  Mon 12/09/20 Spoke with sister over the phone who states she was unable to come to the hospital. She was informed of the patient's death. She states the patient has had issues with her asthma/COPD and sugar recently.  [CS]  6720 NOBSJ with the ME, she will not be an ME case.  [CS]    Clinical Course User Index [CS] Truddie Hidden, MD  Final Clinical Impression(s) / ED Diagnoses Final diagnoses:  None    Rx / DC Orders ED Discharge Orders     None        Truddie Hidden, MD 11/18/2020 [phone]

## 2020-11-16 NOTE — ED Notes (Signed)
Attempted to contact family with no answer. Pt taken to morgue.

## 2020-11-16 NOTE — Code Documentation (Signed)
Pulse check and EDP reports no cardiac movement on [place]

## 2020-11-16 NOTE — Code Documentation (Signed)
Patient time of death occurred at 43

## 2020-11-16 NOTE — Progress Notes (Signed)
   12/14/20 1430  Clinical Encounter Type  Visited With Other (Comment) (Call from Nurse, Burman Nieves)  Referral From Nurse  Consult/Referral To Chaplain   Chaplain received a call from [facility] in ED stating she needed help contacting the patient's family. Maggie said to inform family that we are informing them that their family member was brought to ED.  Chaplain called the spouse's number on file, and it is no longer in service, called the patient's mother's number on file 2x's and got no answer, this chaplain called the patient's sister Donalee Citrin and informed her, and she stated someone would come to the hospital. This note was prepared by Jeanine Luz, M.Div..  For questions please contact by phone [phone].

## 2020-11-16 NOTE — ED Triage Notes (Signed)
[place] [facility] [place] slumped over holding inhaler. CPR started by EMS, 6 epi given. Pt initially asystole. Arrives in [facility], king airway. 18G RAC and IO.

## 2020-11-16 DEATH — deceased

## 2023-03-02 IMAGING — MR MR CERVICAL SPINE W/O CM
5 series · 29 of 48 positions shown · non-contrast
Comparison: 10/26/2018

CLINICAL DATA: Cervical radiculopathy; technologist note states
chronic neck pain radiating into left arm with numbness

EXAM:
MRI CERVICAL SPINE WITHOUT CONTRAST
TECHNIQUE: Multiplanar, multisequence MR imaging of the cervical spine was
performed. No intravenous contrast was administered.

[Series 5: T2 · sagittal · 3.0mm · 0.66mm/px · 6 of 15 slices shown (1 of 2)]
[im 1/15]
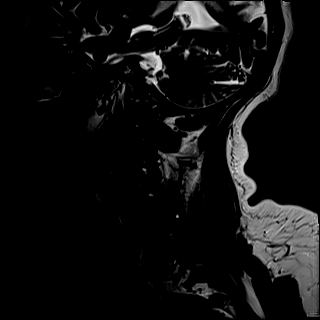
[im 3/15]
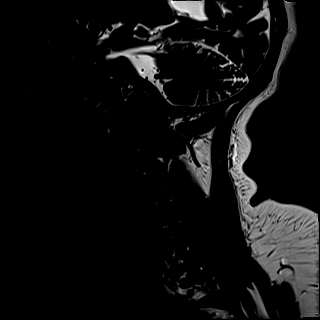
[im 6/15]
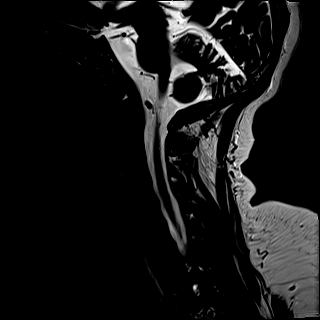
[im 9/15]
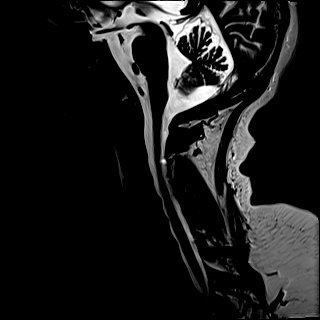
[im 12/15]
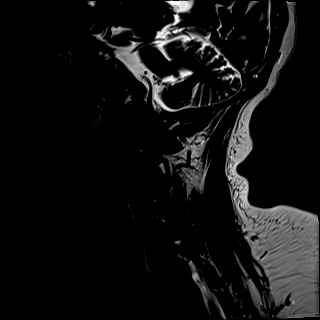
[im 15/15]
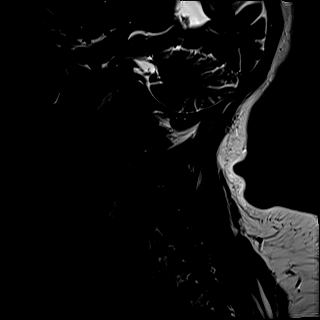

[Series 6: T1 · sagittal · 3.0mm · 0.82mm/px · 7 of 15 slices shown]
[im 1/15]
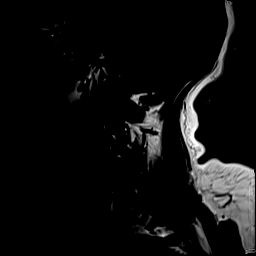
[im 3/15]
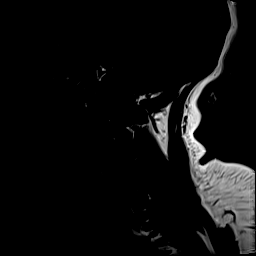
[im 5/15]
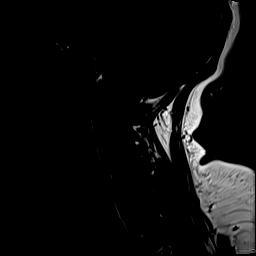
[im 8/15]
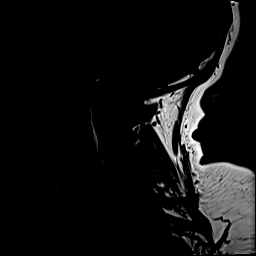
[im 10/15]
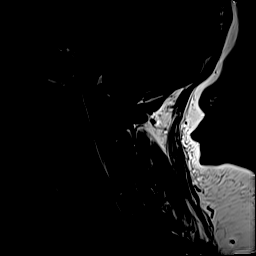
[im 12/15]
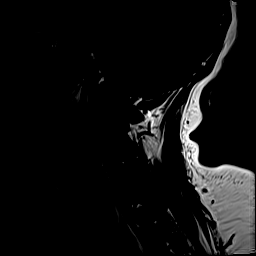
[im 15/15]
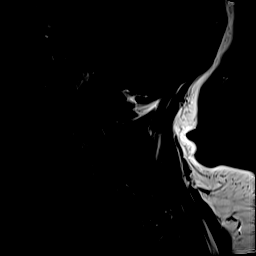

[Series 7: STIR · sagittal · 3.0mm · 0.33mm/px · 7 of 15 slices shown]
[im 1/15]
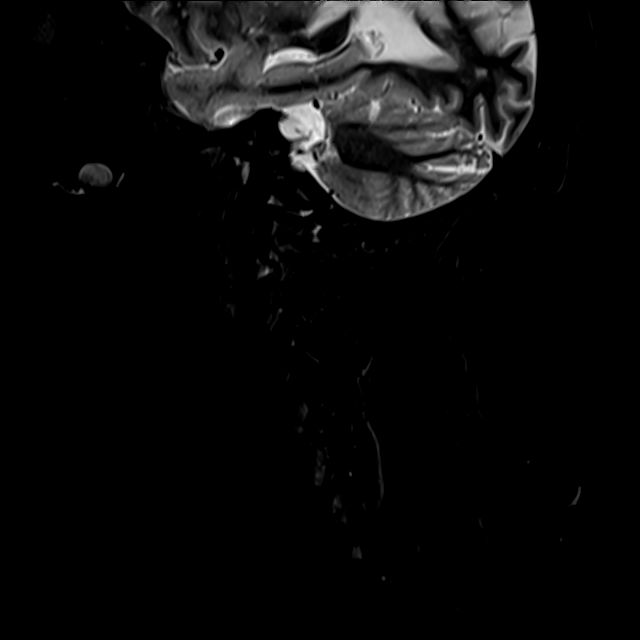
[im 3/15]
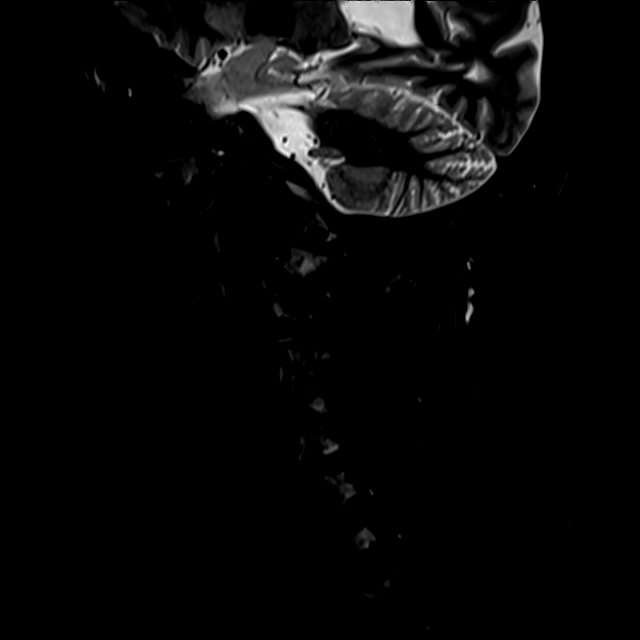
[im 5/15]
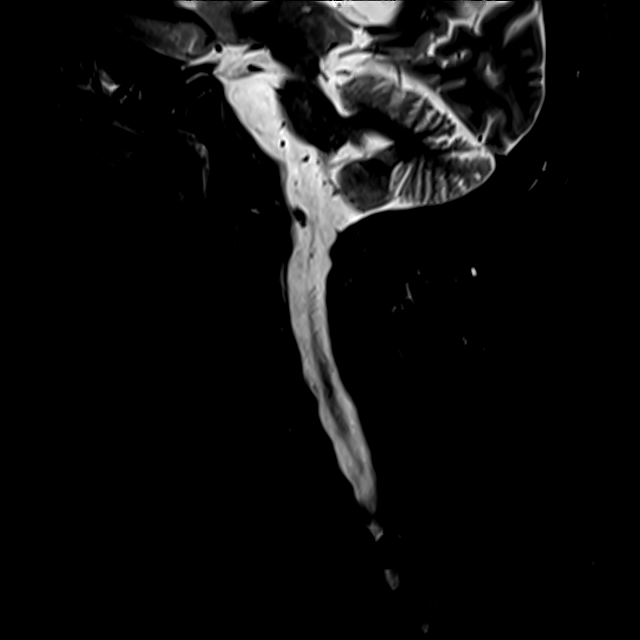
[im 8/15]
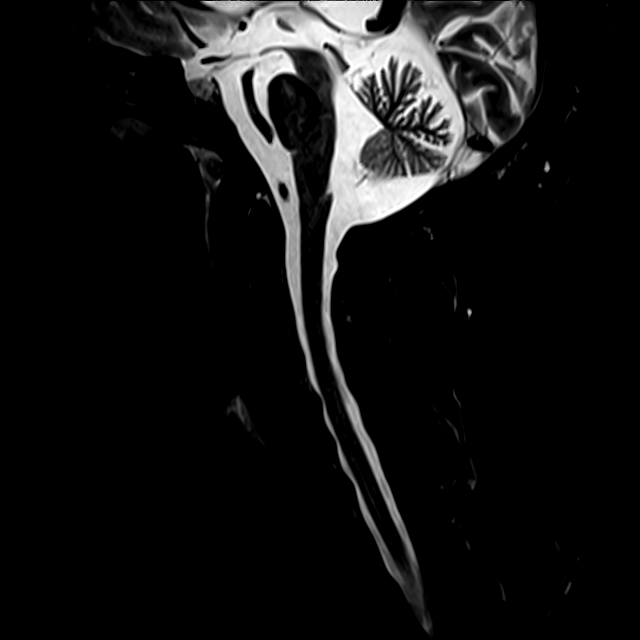
[im 10/15]
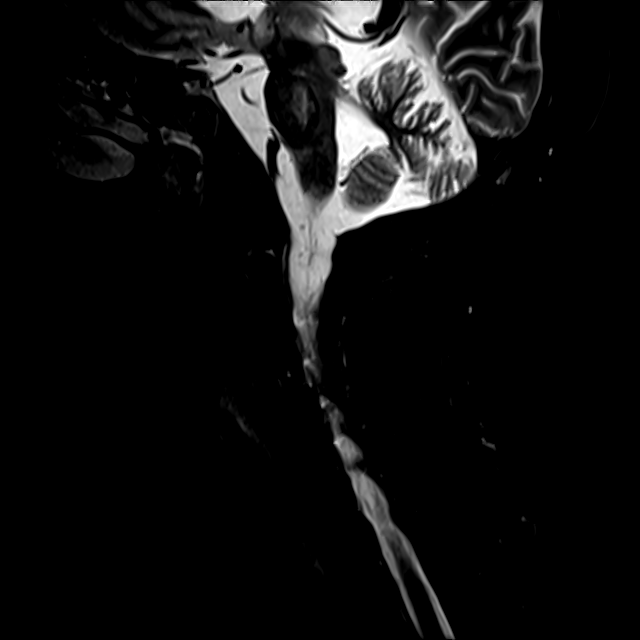
[im 12/15]
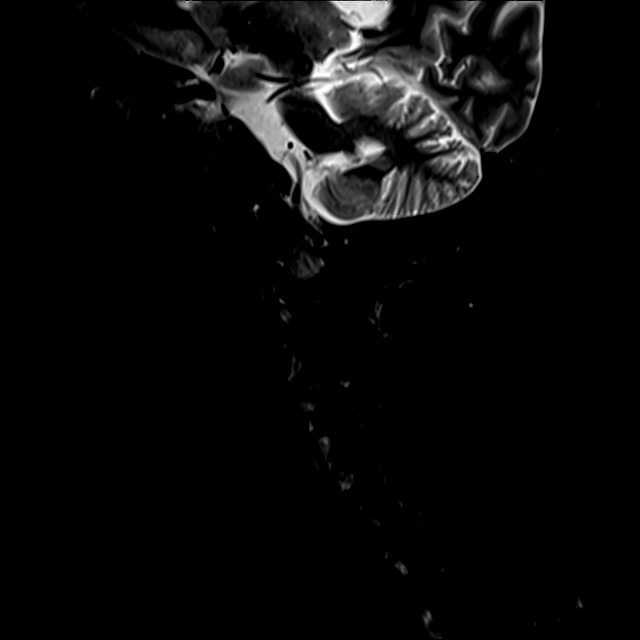
[im 15/15]
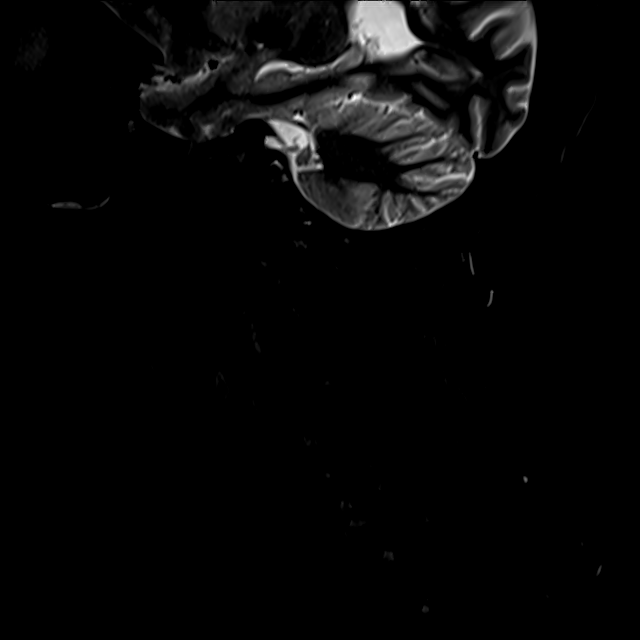

[Series 8: T2 · axial · 3.0mm · 0.50mm/px · z∈[-43,+50]mm · 8 of 30 slices shown (2 of 2)]
[im 1/30]
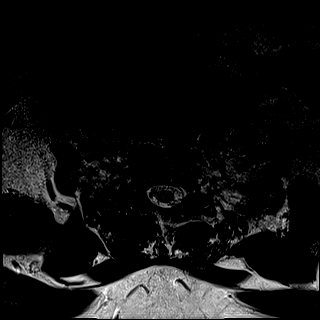
[im 5/30]
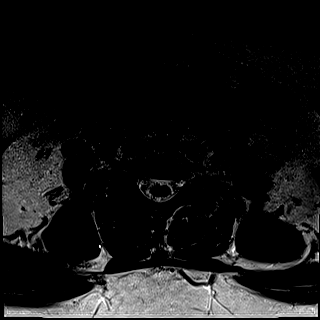
[im 9/30]
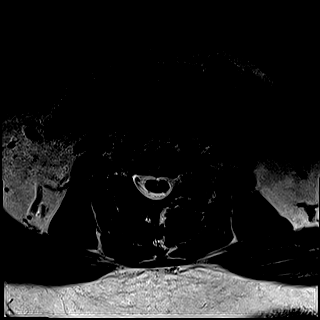
[im 14/30]
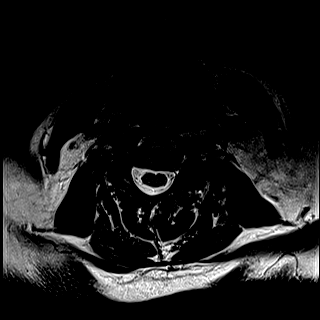
[im 16/30]
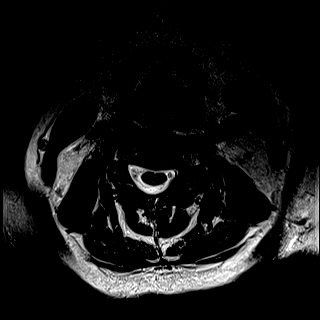
[im 21/30]
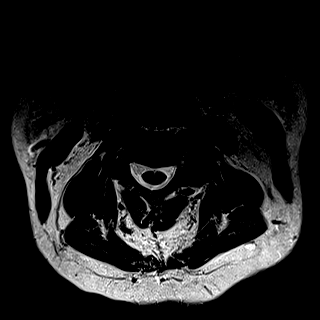
[im 25/30]
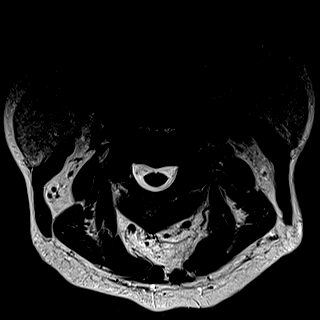
[im 30/30]
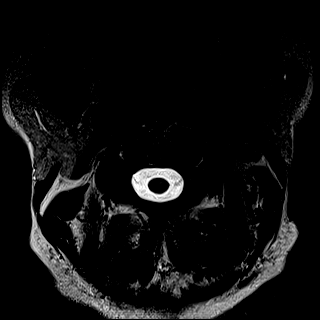

[Series 9: GRE · axial · 3.0mm · 0.42mm/px · 1 of 30 slices shown]
[im 1/30]
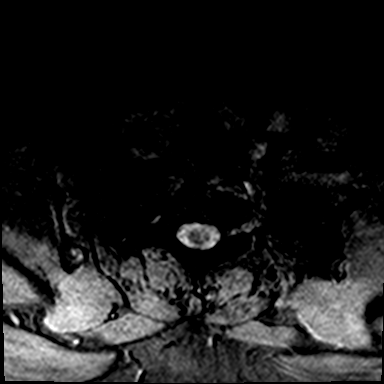

[29 of 48 positions shown; findings below may reference images not displayed]

FINDINGS: Alignment: Stable with trace anterolisthesis at C4-C5

Vertebrae: Stable vertebral body heights. No substantial marrow
edema. No suspicious osseous lesion.

Cord: No abnormal signal.

Posterior Fossa, vertebral arteries, paraspinal tissues:
Unremarkable.

Disc levels:

C2-C3:  Facet hypertrophy.  No canal or foraminal stenosis.

C3-C4: Trace central disc protrusion. Left greater than right facet
hypertrophy. No canal or foraminal stenosis.

C4-C5: Disc bulge with endplate osteophytes. Left greater than right
facet and uncovertebral hypertrophy. No canal or right foraminal
stenosis. Moderate to marked left foraminal stenosis.

C5-C6: Disc bulge slightly eccentric to the left with endplate
osteophytes. Left greater than right facet hypertrophy. No canal or
right foraminal stenosis. Minor left foraminal stenosis.

C6-C7: Disc bulge with endplate osteophytes. Left greater than right
facet hypertrophy. No canal or foraminal stenosis.

C7-T1: Disc bulge. Left greater than right facet hypertrophy. No
canal or right foraminal stenosis. Mild left foraminal stenosis.
IMPRESSION: Multilevel degenerative changes as detailed above similar to the
prior study. There is no high-grade canal stenosis. Left foraminal
stenosis is greatest at C4-C5.
